# Patient Record
Sex: Female | Born: 1945 | Race: Black or African American | Hispanic: No | Smoking: Never smoker
Health system: Southern US, Community
[De-identification: ages and names within clinical notes are randomized; demographics above are authoritative.]

## PROBLEM LIST (undated history)

## (undated) DIAGNOSIS — E119 Type 2 diabetes mellitus without complications: Secondary | ICD-10-CM

## (undated) DIAGNOSIS — I10 Essential (primary) hypertension: Secondary | ICD-10-CM

## (undated) DIAGNOSIS — J45909 Unspecified asthma, uncomplicated: Secondary | ICD-10-CM

## (undated) DIAGNOSIS — K219 Gastro-esophageal reflux disease without esophagitis: Secondary | ICD-10-CM

## (undated) DIAGNOSIS — E669 Obesity, unspecified: Secondary | ICD-10-CM

---

## 2004-08-28 ENCOUNTER — Ambulatory Visit: Payer: Self-pay | Admitting: Ophthalmology

## 2005-02-07 ENCOUNTER — Ambulatory Visit: Payer: Self-pay | Admitting: Family Medicine

## 2005-06-18 ENCOUNTER — Ambulatory Visit: Payer: Self-pay | Admitting: Internal Medicine

## 2006-05-16 ENCOUNTER — Emergency Department: Payer: Self-pay | Admitting: Emergency Medicine

## 2006-05-19 ENCOUNTER — Inpatient Hospital Stay: Payer: Self-pay

## 2006-05-20 ENCOUNTER — Other Ambulatory Visit: Payer: Self-pay

## 2006-05-27 ENCOUNTER — Emergency Department: Payer: Self-pay | Admitting: Emergency Medicine

## 2006-06-05 ENCOUNTER — Ambulatory Visit: Payer: Self-pay | Admitting: Urology

## 2006-06-18 ENCOUNTER — Ambulatory Visit: Payer: Self-pay | Admitting: Family Medicine

## 2006-06-30 ENCOUNTER — Encounter: Payer: Self-pay | Admitting: Family Medicine

## 2006-07-18 ENCOUNTER — Ambulatory Visit: Payer: Self-pay | Admitting: Otolaryngology

## 2006-07-23 ENCOUNTER — Ambulatory Visit: Payer: Self-pay | Admitting: Internal Medicine

## 2006-08-25 ENCOUNTER — Encounter: Payer: Self-pay | Admitting: Family Medicine

## 2006-09-02 ENCOUNTER — Ambulatory Visit: Payer: Self-pay | Admitting: Family Medicine

## 2006-09-25 ENCOUNTER — Encounter: Payer: Self-pay | Admitting: Family Medicine

## 2006-10-08 ENCOUNTER — Ambulatory Visit: Payer: Self-pay | Admitting: Family Medicine

## 2007-02-18 ENCOUNTER — Ambulatory Visit: Payer: Self-pay | Admitting: Family Medicine

## 2007-07-05 ENCOUNTER — Emergency Department: Payer: Self-pay | Admitting: Emergency Medicine

## 2007-07-17 ENCOUNTER — Emergency Department: Payer: Self-pay | Admitting: Emergency Medicine

## 2007-09-04 ENCOUNTER — Ambulatory Visit: Payer: Self-pay | Admitting: Family Medicine

## 2008-04-12 ENCOUNTER — Other Ambulatory Visit: Payer: Self-pay

## 2008-04-13 ENCOUNTER — Observation Stay: Payer: Self-pay | Admitting: Internal Medicine

## 2008-08-10 ENCOUNTER — Emergency Department: Payer: Self-pay | Admitting: Emergency Medicine

## 2008-09-05 ENCOUNTER — Ambulatory Visit (HOSPITAL_COMMUNITY): Admission: RE | Admit: 2008-09-05 | Discharge: 2008-09-05 | Payer: Self-pay | Admitting: Sports Medicine

## 2008-09-27 ENCOUNTER — Ambulatory Visit: Payer: Self-pay | Admitting: Family Medicine

## 2010-02-27 ENCOUNTER — Ambulatory Visit: Payer: Self-pay

## 2011-07-24 ENCOUNTER — Ambulatory Visit: Payer: Self-pay | Admitting: Family Medicine

## 2012-09-02 ENCOUNTER — Ambulatory Visit: Payer: Self-pay | Admitting: Family Medicine

## 2013-09-22 ENCOUNTER — Ambulatory Visit: Payer: Self-pay | Admitting: Family Medicine

## 2015-06-01 ENCOUNTER — Ambulatory Visit: Admission: RE | Admit: 2015-06-01 | Payer: Medicare Other | Source: Ambulatory Visit | Admitting: Gastroenterology

## 2015-06-01 ENCOUNTER — Encounter: Admission: RE | Payer: Self-pay | Source: Ambulatory Visit

## 2015-06-01 SURGERY — COLONOSCOPY WITH PROPOFOL
Anesthesia: General

## 2015-06-09 ENCOUNTER — Encounter: Payer: Self-pay | Admitting: *Deleted

## 2015-06-12 ENCOUNTER — Ambulatory Visit: Admission: RE | Admit: 2015-06-12 | Payer: Medicare Other | Source: Ambulatory Visit | Admitting: Gastroenterology

## 2015-06-12 ENCOUNTER — Encounter: Admission: RE | Payer: Self-pay | Source: Ambulatory Visit

## 2015-06-12 HISTORY — DX: Essential (primary) hypertension: I10

## 2015-06-12 HISTORY — DX: Unspecified asthma, uncomplicated: J45.909

## 2015-06-12 HISTORY — DX: Type 2 diabetes mellitus without complications: E11.9

## 2015-06-12 HISTORY — DX: Obesity, unspecified: E66.9

## 2015-06-12 HISTORY — DX: Gastro-esophageal reflux disease without esophagitis: K21.9

## 2015-06-12 SURGERY — COLONOSCOPY WITH PROPOFOL
Anesthesia: General

## 2015-08-05 DIAGNOSIS — K644 Residual hemorrhoidal skin tags: Secondary | ICD-10-CM | POA: Diagnosis not present

## 2015-08-05 DIAGNOSIS — J45909 Unspecified asthma, uncomplicated: Secondary | ICD-10-CM | POA: Diagnosis not present

## 2015-08-05 DIAGNOSIS — E669 Obesity, unspecified: Secondary | ICD-10-CM | POA: Diagnosis not present

## 2015-08-05 DIAGNOSIS — I10 Essential (primary) hypertension: Secondary | ICD-10-CM | POA: Diagnosis not present

## 2015-08-05 DIAGNOSIS — Z1211 Encounter for screening for malignant neoplasm of colon: Secondary | ICD-10-CM | POA: Diagnosis not present

## 2015-08-05 DIAGNOSIS — Z7982 Long term (current) use of aspirin: Secondary | ICD-10-CM | POA: Diagnosis not present

## 2015-08-05 DIAGNOSIS — D120 Benign neoplasm of cecum: Secondary | ICD-10-CM | POA: Diagnosis not present

## 2015-08-05 DIAGNOSIS — Z794 Long term (current) use of insulin: Secondary | ICD-10-CM | POA: Diagnosis not present

## 2015-08-05 DIAGNOSIS — D122 Benign neoplasm of ascending colon: Secondary | ICD-10-CM | POA: Diagnosis not present

## 2015-08-05 DIAGNOSIS — Z6839 Body mass index (BMI) 39.0-39.9, adult: Secondary | ICD-10-CM | POA: Diagnosis not present

## 2015-08-05 DIAGNOSIS — Z7951 Long term (current) use of inhaled steroids: Secondary | ICD-10-CM | POA: Diagnosis not present

## 2015-08-05 DIAGNOSIS — K219 Gastro-esophageal reflux disease without esophagitis: Secondary | ICD-10-CM | POA: Diagnosis not present

## 2015-08-05 DIAGNOSIS — E119 Type 2 diabetes mellitus without complications: Secondary | ICD-10-CM | POA: Diagnosis not present

## 2015-08-07 ENCOUNTER — Encounter
Admission: RE | Disposition: A | Discharge status: Home / Self Care | Payer: Self-pay | Source: Ambulatory Visit | Attending: Gastroenterology

## 2015-08-07 ENCOUNTER — Ambulatory Visit
Admission: RE | Admit: 2015-08-07 | Discharge: 2015-08-07 | Disposition: A | Discharge status: Home / Self Care | Payer: Medicare Other | Source: Ambulatory Visit | Attending: Gastroenterology | Admitting: Gastroenterology

## 2015-08-07 ENCOUNTER — Ambulatory Visit: Payer: Medicare Other | Admitting: Anesthesiology

## 2015-08-07 ENCOUNTER — Encounter: Payer: Self-pay | Admitting: Anesthesiology

## 2015-08-07 DIAGNOSIS — K219 Gastro-esophageal reflux disease without esophagitis: Secondary | ICD-10-CM | POA: Insufficient documentation

## 2015-08-07 DIAGNOSIS — Z1211 Encounter for screening for malignant neoplasm of colon: Secondary | ICD-10-CM | POA: Diagnosis not present

## 2015-08-07 DIAGNOSIS — K644 Residual hemorrhoidal skin tags: Secondary | ICD-10-CM | POA: Insufficient documentation

## 2015-08-07 DIAGNOSIS — Z6839 Body mass index (BMI) 39.0-39.9, adult: Secondary | ICD-10-CM | POA: Insufficient documentation

## 2015-08-07 DIAGNOSIS — Z794 Long term (current) use of insulin: Secondary | ICD-10-CM | POA: Insufficient documentation

## 2015-08-07 DIAGNOSIS — Z7951 Long term (current) use of inhaled steroids: Secondary | ICD-10-CM | POA: Insufficient documentation

## 2015-08-07 DIAGNOSIS — J45909 Unspecified asthma, uncomplicated: Secondary | ICD-10-CM | POA: Insufficient documentation

## 2015-08-07 DIAGNOSIS — E669 Obesity, unspecified: Secondary | ICD-10-CM | POA: Insufficient documentation

## 2015-08-07 DIAGNOSIS — E119 Type 2 diabetes mellitus without complications: Secondary | ICD-10-CM | POA: Insufficient documentation

## 2015-08-07 DIAGNOSIS — D120 Benign neoplasm of cecum: Secondary | ICD-10-CM | POA: Insufficient documentation

## 2015-08-07 DIAGNOSIS — D122 Benign neoplasm of ascending colon: Secondary | ICD-10-CM | POA: Insufficient documentation

## 2015-08-07 DIAGNOSIS — I10 Essential (primary) hypertension: Secondary | ICD-10-CM | POA: Insufficient documentation

## 2015-08-07 DIAGNOSIS — Z7982 Long term (current) use of aspirin: Secondary | ICD-10-CM | POA: Insufficient documentation

## 2015-08-07 HISTORY — PX: COLONOSCOPY WITH PROPOFOL: SHX5780

## 2015-08-07 LAB — GLUCOSE, CAPILLARY: GLUCOSE-CAPILLARY: 171 mg/dL — AB (ref 65–99)

## 2015-08-07 SURGERY — COLONOSCOPY WITH PROPOFOL
Anesthesia: General

## 2015-08-07 MED ORDER — PROPOFOL 10 MG/ML IV BOLUS
INTRAVENOUS | Status: DC | PRN
  Administered 2015-08-07: 80 mg via INTRAVENOUS

## 2015-08-07 MED ORDER — SODIUM CHLORIDE 0.9 % IV SOLN
INTRAVENOUS | Status: DC
  Administered 2015-08-07: 1000 mL via INTRAVENOUS

## 2015-08-07 MED ORDER — SODIUM CHLORIDE 0.9 % IV SOLN: INTRAVENOUS | Status: DC

## 2015-08-07 MED ORDER — PROPOFOL INFUSION 10 MG/ML OPTIME
INTRAVENOUS | Status: DC | PRN
  Administered 2015-08-07: 120 ug/kg/min via INTRAVENOUS

## 2015-08-07 MED ORDER — LIDOCAINE HCL (CARDIAC) 20 MG/ML IV SOLN
INTRAVENOUS | Status: DC | PRN
  Administered 2015-08-07: 100 mg via INTRAVENOUS

## 2015-08-07 NOTE — Anesthesia Preprocedure Evaluation (Addendum)
Anesthesia Evaluation  Patient identified by MRN, date of birth, ID band Patient awake    Reviewed: Allergy & Precautions, H&P , NPO status , Patient's Chart, lab work & pertinent test results  History of Anesthesia Complications Negative for: history of anesthetic complications  Airway Mallampati: III  TM Distance: >3 FB Neck ROM: limited    Dental no notable dental hx. (+) Poor Dentition, Missing, Chipped, Upper Dentures   Pulmonary neg shortness of breath, asthma ,    Pulmonary exam normal breath sounds clear to auscultation       Cardiovascular Exercise Tolerance: Good hypertension, (-) Past MI Normal cardiovascular exam Rhythm:regular Rate:Normal     Neuro/Psych negative neurological ROS  negative psych ROS   GI/Hepatic Neg liver ROS, GERD  Controlled,  Endo/Other  diabetes, Type 2, Insulin Dependent  Renal/GU negative Renal ROS  negative genitourinary   Musculoskeletal   Abdominal   Peds  Hematology negative hematology ROS (+)   Anesthesia Other Findings Past Medical History:   Morbid Obesity                                                      GERD (gastroesophageal reflux disease)                       Hypertension                                                 Asthma                                                       Diabetes mellitus without complication                       Signs and symptoms suggestive of sleep apnea    Reproductive/Obstetrics negative OB ROS                            Anesthesia Physical Anesthesia Plan  ASA: III  Anesthesia Plan: General   Post-op Pain Management:    Induction:   Airway Management Planned:   Additional Equipment:   Intra-op Plan:   Post-operative Plan:   Informed Consent: I have reviewed the patients History and Physical, chart, labs and discussed the procedure including the risks, benefits and alternatives for the  proposed anesthesia with the patient or authorized representative who has indicated his/her understanding and acceptance.   Dental Advisory Given  Plan Discussed with: Anesthesiologist, CRNA and Surgeon  Anesthesia Plan Comments:         Anesthesia Quick Evaluation

## 2015-08-07 NOTE — H&P (Signed)
  Primary Care Physician:  Baltazar Apo, MD  Pre-Procedure History & Physical: HPI:  Gloria Blair is a 69 y.o. female is here for an colonoscopy.   Past Medical History  Diagnosis Date  . Obesity   . GERD (gastroesophageal reflux disease)   . Hypertension   . Asthma   . Diabetes mellitus without complication     History reviewed. No pertinent past surgical history.  Prior to Admission medications   Medication Sig Start Date End Date Taking? Authorizing Provider  albuterol-ipratropium (COMBIVENT) 18-103 MCG/ACT inhaler Inhale 2 puffs into the lungs every 6 (six) hours as needed for wheezing or shortness of breath.   Yes Historical Provider, MD  aspirin 81 MG tablet Take 81 mg by mouth daily.   Yes Historical Provider, MD  atorvastatin (LIPITOR) 80 MG tablet Take 80 mg by mouth daily.   Yes Historical Provider, MD  cloNIDine (CATAPRES) 0.1 MG tablet Take 0.1 mg by mouth 2 (two) times daily.   Yes Historical Provider, MD  geriatric multivitamins-minerals (ELDERTONIC/GEVRABON) ELIX Take 15 mLs by mouth daily.   Yes Historical Provider, MD  glipiZIDE (GLUCOTROL) 10 MG tablet Take 10 mg by mouth daily before breakfast.   Yes Historical Provider, MD  insulin detemir (LEVEMIR) 100 UNIT/ML injection Inject into the skin at bedtime.   Yes Historical Provider, MD  lisinopril-hydrochlorothiazide (PRINZIDE,ZESTORETIC) 20-25 MG per tablet Take 1 tablet by mouth daily.   Yes Historical Provider, MD  meloxicam (MOBIC) 7.5 MG tablet Take 7.5 mg by mouth daily.   Yes Historical Provider, MD  metFORMIN (GLUCOPHAGE) 1000 MG tablet Take 1,000 mg by mouth 2 (two) times daily with a meal.   Yes Historical Provider, MD  Multiple Vitamin (MULTIVITAMIN) capsule Take 1 capsule by mouth daily.   Yes Historical Provider, MD    Allergies as of 07/03/2015  . (No Known Allergies)    History reviewed. No pertinent family history.  Social History   Social History  . Marital Status: Widowed    Spouse Name:  N/A  . Number of Children: N/A  . Years of Education: N/A   Occupational History  . Not on file.   Social History Main Topics  . Smoking status: Not on file  . Smokeless tobacco: Not on file  . Alcohol Use: Not on file  . Drug Use: Not on file  . Sexual Activity: Not on file   Other Topics Concern  . Not on file   Social History Narrative     Physical Exam: BP 129/64 mmHg  Pulse 67  Temp(Src) 97.1 F (36.2 C) (Tympanic)  Resp 12  Ht 5\' 5"  (1.651 m)  Wt 108.863 kg (240 lb)  BMI 39.94 kg/m2  SpO2 98% General:   Alert,  pleasant and cooperative in NAD Head:  Normocephalic and atraumatic. Neck:  Supple; no masses or thyromegaly. Lungs:  Clear throughout to auscultation.    Heart:  Regular rate and rhythm. Abdomen:  Soft, nontender and nondistended. Normal bowel sounds, without guarding, and without rebound.   Neurologic:  Alert and  oriented x4;  grossly normal neurologically.  Impression/Plan: [name] is here for an colonoscopy to be performed for screening  Risks, benefits, limitations, and alternatives regarding  colonoscopy have been reviewed with the patient.  Questions have been answered.  All parties agreeable.   Josefine Class, MD  08/07/2015, 8:11 AM

## 2015-08-07 NOTE — Op Note (Signed)
[facility] Gastroenterology Patient Name: Gloria Blair Procedure Date: 08/07/2015 7:50 AM MRN: [id number] Account #: [id number] Date of Birth: Mar 27, 1946 Admit Type: Outpatient Age: 69 Room: [facility] ENDO ROOM 2 Gender: Female Note Status: Finalized Procedure:         Colonoscopy Indications:       Screening for colorectal malignant neoplasm, Last                     colonoscopy: 2006 Patient Profile:   This is a 69 year old female. Providers:         Gerrit Heck. Rayann Heman, MD Referring MD:      Baltazar Apo, MD (Referring MD) Medicines:         Propofol per Anesthesia Complications:     No immediate complications. Procedure:         Pre-Anesthesia Assessment:                    - Prior to the procedure, a History and Physical was                     performed, and patient medications, allergies and                     sensitivities were reviewed. The patient's tolerance of                     previous anesthesia was reviewed.                    After obtaining informed consent, the colonoscope was                     passed under direct vision. Throughout the procedure, the                     patient's blood pressure, pulse, and oxygen saturations                     were monitored continuously. The Colonoscope was                     introduced through the anus and advanced to the the cecum,                     identified by appendiceal orifice and ileocecal valve. The                     colonoscopy was performed without difficulty. The patient                     tolerated the procedure well. The quality of the bowel                     preparation was good. Findings:      The perianal exam findings include non-thrombosed external hemorrhoids.      A 2 mm polyp was found in the cecum. The polyp was sessile. The polyp       was removed with a jumbo cold forceps. Resection and retrieval were       complete.      A 2 mm polyp was found in the ascending colon. The  polyp was sessile.      The exam was otherwise without abnormality on direct and retroflexion       views.  Impression:        - Non-thrombosed external hemorrhoids found on perianal                     exam.                    - One 2 mm polyp in the cecum. Resected and retrieved.                    - One 2 mm polyp in the ascending colon.                    - The examination was otherwise normal on direct and                     retroflexion views. Recommendation:    - Observe patient in GI recovery unit.                    - High fiber diet.                    - Continue present medications.                    - Await pathology results.                    - Repeat colonoscopy for surveillance based on pathology                     results.                    - Return to referring physician.                    - The findings and recommendations were discussed with the                     patient.                    - The findings and recommendations were discussed with the                     patient's family. Procedure Code(s): --- Professional ---                    [phone], Colonoscopy, flexible; with biopsy, single or                     multiple CPT copyright 2014 American Medical Association. All rights reserved. The codes documented in this report are preliminary and upon coder review may  be revised to meet current compliance requirements. Mellody Life, MD 08/07/2015 8:38:55 AM This report has been signed electronically. Number of Addenda: 0 Note Initiated On: 08/07/2015 7:50 AM Scope Withdrawal Time: 0 hours 14 minutes 45 seconds  Total Procedure Duration: 0 hours 17 minutes 49 seconds       [facility]

## 2015-08-07 NOTE — Anesthesia Postprocedure Evaluation (Signed)
  Anesthesia Post-op Note  Patient: Gloria Blair  Procedure(s) Performed: Procedure(s): COLONOSCOPY WITH PROPOFOL (N/A)  Anesthesia type:General  Patient location: PACU  Post pain: Pain level controlled  Post assessment: Post-op Vital signs reviewed, Patient's Cardiovascular Status Stable, Respiratory Function Stable, Patent Airway and No signs of Nausea or vomiting  Post vital signs: Reviewed and stable  Last Vitals:  Filed Vitals:   08/07/15 0909  BP: 138/65  Pulse: 63  Temp:   Resp: 15    Level of consciousness: awake, alert  and patient cooperative  Complications: No apparent anesthesia complications

## 2015-08-07 NOTE — Discharge Instructions (Signed)

## 2015-08-07 NOTE — Transfer of Care (Signed)
Immediate Anesthesia Transfer of Care Note  Patient: Gloria Blair  Procedure(s) Performed: Procedure(s): COLONOSCOPY WITH PROPOFOL (N/A)  Patient Location: Endoscopy Unit  Anesthesia Type:General  Level of Consciousness: sedated  Airway & Oxygen Therapy: Patient Spontanous Breathing and Patient connected to nasal cannula oxygen  Post-op Assessment: Report given to RN and Post -op Vital signs reviewed and stable  Post vital signs: Reviewed and stable  Last Vitals:  Filed Vitals:   08/07/15 0708  BP: 129/64  Pulse: 67  Temp: 36.2 C  Resp: 12    Complications: No apparent anesthesia complications

## 2015-08-08 ENCOUNTER — Encounter: Payer: Self-pay | Admitting: Gastroenterology

## 2015-08-08 LAB — SURGICAL PATHOLOGY

## 2015-11-02 ENCOUNTER — Other Ambulatory Visit: Payer: Self-pay | Admitting: Family Medicine

## 2015-11-02 DIAGNOSIS — Z1231 Encounter for screening mammogram for malignant neoplasm of breast: Secondary | ICD-10-CM

## 2015-11-14 ENCOUNTER — Ambulatory Visit: Payer: Medicare Other | Attending: Family Medicine

## 2016-06-12 ENCOUNTER — Other Ambulatory Visit: Payer: Self-pay | Admitting: Family Medicine

## 2016-06-12 ENCOUNTER — Ambulatory Visit: Payer: Medicare Other

## 2016-06-12 ENCOUNTER — Ambulatory Visit
Admission: RE | Admit: 2016-06-12 | Discharge: 2016-06-12 | Disposition: A | Discharge status: Home / Self Care | Payer: Medicare Other | Source: Ambulatory Visit | Attending: Family Medicine | Admitting: Family Medicine

## 2016-06-12 DIAGNOSIS — Z1231 Encounter for screening mammogram for malignant neoplasm of breast: Secondary | ICD-10-CM | POA: Diagnosis present

## 2017-12-29 ENCOUNTER — Other Ambulatory Visit: Payer: Self-pay | Admitting: Family Medicine

## 2017-12-29 DIAGNOSIS — Z1231 Encounter for screening mammogram for malignant neoplasm of breast: Secondary | ICD-10-CM

## 2018-01-13 ENCOUNTER — Ambulatory Visit
Admission: RE | Admit: 2018-01-13 | Discharge: 2018-01-13 | Disposition: A | Discharge status: Home / Self Care | Payer: Medicare Other | Source: Ambulatory Visit | Attending: Family Medicine | Admitting: Family Medicine

## 2018-01-13 DIAGNOSIS — Z1231 Encounter for screening mammogram for malignant neoplasm of breast: Secondary | ICD-10-CM | POA: Diagnosis present

## 2018-08-18 ENCOUNTER — Encounter: Payer: Self-pay | Admitting: Medical Oncology

## 2018-08-18 ENCOUNTER — Emergency Department
Admission: EM | Admit: 2018-08-18 | Discharge: 2018-08-18 | Disposition: A | Discharge status: Home / Self Care | Payer: Medicare Other | Attending: Emergency Medicine | Admitting: Emergency Medicine

## 2018-08-18 DIAGNOSIS — R51 Headache: Secondary | ICD-10-CM | POA: Diagnosis not present

## 2018-08-18 DIAGNOSIS — Z5321 Procedure and treatment not carried out due to patient leaving prior to being seen by health care provider: Secondary | ICD-10-CM | POA: Insufficient documentation

## 2018-08-18 DIAGNOSIS — M542 Cervicalgia: Secondary | ICD-10-CM | POA: Diagnosis not present

## 2018-08-18 NOTE — ED Triage Notes (Signed)
Pt reports that she began yesterday having neck pain, pain has now began to radiate into head. Pt denies injury, denies fever. Denies hx of migraine headaches.

## 2019-02-03 ENCOUNTER — Other Ambulatory Visit: Payer: Self-pay | Admitting: Family Medicine

## 2019-02-03 DIAGNOSIS — Z1231 Encounter for screening mammogram for malignant neoplasm of breast: Secondary | ICD-10-CM

## 2019-02-10 ENCOUNTER — Ambulatory Visit
Admission: RE | Admit: 2019-02-10 | Discharge: 2019-02-10 | Disposition: A | Discharge status: Home / Self Care | Payer: Medicare Other | Source: Ambulatory Visit | Attending: Family Medicine | Admitting: Family Medicine

## 2019-02-10 ENCOUNTER — Other Ambulatory Visit: Payer: Self-pay

## 2019-02-10 DIAGNOSIS — Z1231 Encounter for screening mammogram for malignant neoplasm of breast: Secondary | ICD-10-CM | POA: Diagnosis present

## 2020-01-11 ENCOUNTER — Inpatient Hospital Stay
Admission: EM | Admit: 2020-01-11 | Discharge: 2020-01-13 | DRG: 177 | Disposition: A | Discharge status: Home / Self Care | Payer: Medicare Other | Attending: Internal Medicine | Admitting: Internal Medicine

## 2020-01-11 ENCOUNTER — Other Ambulatory Visit: Payer: Self-pay

## 2020-01-11 ENCOUNTER — Emergency Department: Payer: Medicare Other

## 2020-01-11 DIAGNOSIS — I700 Atherosclerosis of aorta: Secondary | ICD-10-CM | POA: Diagnosis present

## 2020-01-11 DIAGNOSIS — J45909 Unspecified asthma, uncomplicated: Secondary | ICD-10-CM | POA: Diagnosis present

## 2020-01-11 DIAGNOSIS — Z794 Long term (current) use of insulin: Secondary | ICD-10-CM | POA: Diagnosis not present

## 2020-01-11 DIAGNOSIS — Z791 Long term (current) use of non-steroidal anti-inflammatories (NSAID): Secondary | ICD-10-CM

## 2020-01-11 DIAGNOSIS — I4891 Unspecified atrial fibrillation: Secondary | ICD-10-CM | POA: Diagnosis present

## 2020-01-11 DIAGNOSIS — Z7982 Long term (current) use of aspirin: Secondary | ICD-10-CM

## 2020-01-11 DIAGNOSIS — E11649 Type 2 diabetes mellitus with hypoglycemia without coma: Secondary | ICD-10-CM | POA: Diagnosis not present

## 2020-01-11 DIAGNOSIS — J1282 Pneumonia due to coronavirus disease 2019: Secondary | ICD-10-CM | POA: Diagnosis not present

## 2020-01-11 DIAGNOSIS — K219 Gastro-esophageal reflux disease without esophagitis: Secondary | ICD-10-CM | POA: Diagnosis present

## 2020-01-11 DIAGNOSIS — Z6837 Body mass index (BMI) 37.0-37.9, adult: Secondary | ICD-10-CM | POA: Diagnosis not present

## 2020-01-11 DIAGNOSIS — E785 Hyperlipidemia, unspecified: Secondary | ICD-10-CM | POA: Diagnosis present

## 2020-01-11 DIAGNOSIS — Z79899 Other long term (current) drug therapy: Secondary | ICD-10-CM | POA: Diagnosis not present

## 2020-01-11 DIAGNOSIS — E669 Obesity, unspecified: Secondary | ICD-10-CM | POA: Diagnosis present

## 2020-01-11 DIAGNOSIS — I10 Essential (primary) hypertension: Secondary | ICD-10-CM | POA: Diagnosis present

## 2020-01-11 DIAGNOSIS — U071 COVID-19: Principal | ICD-10-CM | POA: Diagnosis present

## 2020-01-11 DIAGNOSIS — I480 Paroxysmal atrial fibrillation: Secondary | ICD-10-CM | POA: Diagnosis not present

## 2020-01-11 LAB — LACTIC ACID, PLASMA: Lactic Acid, Venous: 1.7 mmol/L (ref 0.5–1.9)

## 2020-01-11 LAB — FIBRIN DERIVATIVES D-DIMER (ARMC ONLY): Fibrin derivatives D-dimer (ARMC): 965.38 ng/mL (FEU) — ABNORMAL HIGH (ref 0.00–499.00)

## 2020-01-11 LAB — CBC
HCT: 40.4 % (ref 36.0–46.0)
Hemoglobin: 12.9 g/dL (ref 12.0–15.0)
MCH: 26 pg (ref 26.0–34.0)
MCHC: 31.9 g/dL (ref 30.0–36.0)
MCV: 81.3 fL (ref 80.0–100.0)
Platelets: 236 10*3/uL (ref 150–400)
RBC: 4.97 MIL/uL (ref 3.87–5.11)
RDW: 16.2 % — ABNORMAL HIGH (ref 11.5–15.5)
WBC: 5.4 10*3/uL (ref 4.0–10.5)
nRBC: 0 % (ref 0.0–0.2)

## 2020-01-11 LAB — BASIC METABOLIC PANEL
Anion gap: 10 (ref 5–15)
BUN: 17 mg/dL (ref 8–23)
CO2: 26 mmol/L (ref 22–32)
Calcium: 8.6 mg/dL — ABNORMAL LOW (ref 8.9–10.3)
Chloride: 100 mmol/L (ref 98–111)
Creatinine, Ser: 0.87 mg/dL (ref 0.44–1.00)
GFR calc Af Amer: >60 mL/min (ref >60)
GFR calc non Af Amer: >60 mL/min (ref >60)
Glucose, Bld: 101 mg/dL — ABNORMAL HIGH (ref 70–99)
Potassium: 3.5 mmol/L (ref 3.5–5.1)
Sodium: 136 mmol/L (ref 135–145)

## 2020-01-11 LAB — HEPATIC FUNCTION PANEL
ALT: 25 U/L (ref 0–44)
AST: 53 U/L — ABNORMAL HIGH (ref 15–41)
Albumin: 3.5 g/dL (ref 3.5–5.0)
Alkaline Phosphatase: 61 U/L (ref 38–126)
Bilirubin, Direct: 0.4 mg/dL — ABNORMAL HIGH (ref 0.0–0.2)
Indirect Bilirubin: 0.7 mg/dL (ref 0.3–0.9)
Total Bilirubin: 1.1 mg/dL (ref 0.3–1.2)
Total Protein: 8.2 g/dL — ABNORMAL HIGH (ref 6.5–8.1)

## 2020-01-11 LAB — FIBRINOGEN: Fibrinogen: 531 mg/dL — ABNORMAL HIGH (ref 210–475)

## 2020-01-11 LAB — MAGNESIUM: Magnesium: 1.5 mg/dL — ABNORMAL LOW (ref 1.7–2.4)

## 2020-01-11 LAB — FERRITIN: Ferritin: 201 ng/mL (ref 11–307)

## 2020-01-11 LAB — TSH: TSH: 0.423 u[IU]/mL (ref 0.350–4.500)

## 2020-01-11 LAB — ABO/RH: ABO/RH(D): O POS

## 2020-01-11 LAB — POC SARS CORONAVIRUS 2 AG: SARS Coronavirus 2 Ag: POSITIVE — AB

## 2020-01-11 LAB — PROCALCITONIN: Procalcitonin: <0.1 ng/mL

## 2020-01-11 LAB — C-REACTIVE PROTEIN: CRP: 3.5 mg/dL — ABNORMAL HIGH (ref <1.0)

## 2020-01-11 LAB — LACTATE DEHYDROGENASE: LDH: 192 U/L (ref 98–192)

## 2020-01-11 LAB — TRIGLYCERIDES: Triglycerides: 93 mg/dL (ref <150)

## 2020-01-11 LAB — T4, FREE: Free T4: 1.1 ng/dL (ref 0.61–1.12)

## 2020-01-11 MED ORDER — POTASSIUM CHLORIDE 20 MEQ PO PACK
40.0000 meq | PACK | Freq: Once | ORAL | Status: AC
  Administered 2020-01-11: 23:00:00 40 meq via ORAL
  Filled 2020-01-11: qty 2

## 2020-01-11 MED ORDER — DEXAMETHASONE SODIUM PHOSPHATE 10 MG/ML IJ SOLN
6.0000 mg | INTRAMUSCULAR | Status: DC
  Administered 2020-01-11 – 2020-01-12 (×2): 6 mg via INTRAVENOUS
  Filled 2020-01-11 (×2): qty 1

## 2020-01-11 MED ORDER — ACETAMINOPHEN 325 MG PO TABS
650.0000 mg | ORAL_TABLET | Freq: Four times a day (QID) | ORAL | Status: DC | PRN
  Administered 2020-01-12: 650 mg via ORAL
  Filled 2020-01-11: qty 2

## 2020-01-11 MED ORDER — INSULIN ASPART 100 UNIT/ML ~~LOC~~ SOLN
0.0000 [IU] | Freq: Three times a day (TID) | SUBCUTANEOUS | Status: DC
  Administered 2020-01-12 (×2): 2 [IU] via SUBCUTANEOUS
  Administered 2020-01-13: 08:00:00 5 [IU] via SUBCUTANEOUS
  Filled 2020-01-11 (×3): qty 1

## 2020-01-11 MED ORDER — APIXABAN 5 MG PO TABS
5.0000 mg | ORAL_TABLET | Freq: Two times a day (BID) | ORAL | Status: DC
  Administered 2020-01-11 – 2020-01-13 (×4): 5 mg via ORAL
  Filled 2020-01-11 (×4): qty 1

## 2020-01-11 MED ORDER — ADULT MULTIVITAMIN W/MINERALS CH
1.0000 | ORAL_TABLET | Freq: Every day | ORAL | Status: DC
  Administered 2020-01-11 – 2020-01-13 (×3): 1 via ORAL
  Filled 2020-01-11 (×3): qty 1

## 2020-01-11 MED ORDER — ATORVASTATIN CALCIUM 20 MG PO TABS
80.0000 mg | ORAL_TABLET | Freq: Every day | ORAL | Status: DC
  Administered 2020-01-11 – 2020-01-13 (×3): 80 mg via ORAL
  Filled 2020-01-11 (×3): qty 4

## 2020-01-11 MED ORDER — SODIUM CHLORIDE 0.9 % IV SOLN: 200.0000 mg | Freq: Once | INTRAVENOUS | Status: DC

## 2020-01-11 MED ORDER — ACETAMINOPHEN 500 MG PO TABS
1000.0000 mg | ORAL_TABLET | Freq: Once | ORAL | Status: AC
  Administered 2020-01-11: 1000 mg via ORAL
  Filled 2020-01-11: qty 2

## 2020-01-11 MED ORDER — LISINOPRIL 20 MG PO TABS
20.0000 mg | ORAL_TABLET | Freq: Every day | ORAL | Status: DC
  Administered 2020-01-11 – 2020-01-13 (×3): 20 mg via ORAL
  Filled 2020-01-11 (×3): qty 1

## 2020-01-11 MED ORDER — MAGNESIUM HYDROXIDE 400 MG/5ML PO SUSP
30.0000 mL | Freq: Every day | ORAL | Status: DC | PRN
  Filled 2020-01-11: qty 30

## 2020-01-11 MED ORDER — SODIUM CHLORIDE 0.9 % IV BOLUS
500.0000 mL | Freq: Once | INTRAVENOUS | Status: AC
  Administered 2020-01-11: 500 mL via INTRAVENOUS

## 2020-01-11 MED ORDER — ONDANSETRON HCL 4 MG/2ML IJ SOLN: 4.0000 mg | Freq: Four times a day (QID) | INTRAMUSCULAR | Status: DC | PRN

## 2020-01-11 MED ORDER — HYDROCOD POLST-CPM POLST ER 10-8 MG/5ML PO SUER: 5.0000 mL | Freq: Two times a day (BID) | ORAL | Status: DC | PRN

## 2020-01-11 MED ORDER — ALBUTEROL SULFATE HFA 108 (90 BASE) MCG/ACT IN AERS
2.0000 | INHALATION_SPRAY | Freq: Four times a day (QID) | RESPIRATORY_TRACT | Status: DC | PRN
  Filled 2020-01-11: qty 6.7

## 2020-01-11 MED ORDER — IOHEXOL 350 MG/ML SOLN
75.0000 mL | Freq: Once | INTRAVENOUS | Status: AC | PRN
  Administered 2020-01-11: 75 mL via INTRAVENOUS

## 2020-01-11 MED ORDER — ENOXAPARIN SODIUM 40 MG/0.4ML ~~LOC~~ SOLN: 40.0000 mg | SUBCUTANEOUS | Status: DC

## 2020-01-11 MED ORDER — ADULT MULTIVITAMIN LIQUID CH: 15.0000 mL | Freq: Every day | ORAL | Status: DC

## 2020-01-11 MED ORDER — ASPIRIN EC 81 MG PO TBEC
81.0000 mg | DELAYED_RELEASE_TABLET | Freq: Every day | ORAL | Status: DC
  Administered 2020-01-11 – 2020-01-13 (×3): 81 mg via ORAL
  Filled 2020-01-11 (×3): qty 1

## 2020-01-11 MED ORDER — SODIUM CHLORIDE 0.9 % IV SOLN
100.0000 mg | Freq: Every day | INTRAVENOUS | Status: DC
  Administered 2020-01-12: 09:00:00 100 mg via INTRAVENOUS
  Filled 2020-01-11: qty 20

## 2020-01-11 MED ORDER — SODIUM CHLORIDE 0.9 % IV SOLN: 100.0000 mg | Freq: Every day | INTRAVENOUS | Status: DC

## 2020-01-11 MED ORDER — FAMOTIDINE 20 MG PO TABS
20.0000 mg | ORAL_TABLET | Freq: Two times a day (BID) | ORAL | Status: DC
  Administered 2020-01-11 – 2020-01-13 (×4): 20 mg via ORAL
  Filled 2020-01-11 (×4): qty 1

## 2020-01-11 MED ORDER — LISINOPRIL-HYDROCHLOROTHIAZIDE 20-25 MG PO TABS: 1.0000 | ORAL_TABLET | Freq: Every day | ORAL | Status: DC

## 2020-01-11 MED ORDER — TRAZODONE HCL 50 MG PO TABS
25.0000 mg | ORAL_TABLET | Freq: Every evening | ORAL | Status: DC | PRN
  Administered 2020-01-12: 22:00:00 25 mg via ORAL
  Filled 2020-01-11: qty 1

## 2020-01-11 MED ORDER — IPRATROPIUM-ALBUTEROL 20-100 MCG/ACT IN AERS
1.0000 | INHALATION_SPRAY | Freq: Four times a day (QID) | RESPIRATORY_TRACT | Status: DC | PRN
  Filled 2020-01-11: qty 4

## 2020-01-11 MED ORDER — INSULIN DETEMIR 100 UNIT/ML ~~LOC~~ SOLN
20.0000 [IU] | Freq: Every day | SUBCUTANEOUS | Status: DC
  Administered 2020-01-12 – 2020-01-13 (×2): 20 [IU] via SUBCUTANEOUS
  Filled 2020-01-11 (×2): qty 1

## 2020-01-11 MED ORDER — ASPIRIN EC 81 MG PO TBEC: 81.0000 mg | DELAYED_RELEASE_TABLET | Freq: Every day | ORAL | Status: DC

## 2020-01-11 MED ORDER — SODIUM CHLORIDE 0.9 % IV SOLN: INTRAVENOUS | Status: DC

## 2020-01-11 MED ORDER — ONDANSETRON HCL 4 MG PO TABS: 4.0000 mg | ORAL_TABLET | Freq: Four times a day (QID) | ORAL | Status: DC | PRN

## 2020-01-11 MED ORDER — SODIUM CHLORIDE 0.9 % IV SOLN
200.0000 mg | Freq: Once | INTRAVENOUS | Status: AC
  Administered 2020-01-11: 23:00:00 200 mg via INTRAVENOUS
  Filled 2020-01-11: qty 200

## 2020-01-11 MED ORDER — GUAIFENESIN-DM 100-10 MG/5ML PO SYRP: 10.0000 mL | ORAL_SOLUTION | ORAL | Status: DC | PRN

## 2020-01-11 MED ORDER — GLIPIZIDE 10 MG PO TABS
10.0000 mg | ORAL_TABLET | Freq: Every day | ORAL | Status: DC
  Administered 2020-01-12 – 2020-01-13 (×2): 10 mg via ORAL
  Filled 2020-01-11 (×3): qty 1

## 2020-01-11 MED ORDER — VITAMIN D 25 MCG (1000 UNIT) PO TABS
1000.0000 [IU] | ORAL_TABLET | Freq: Every day | ORAL | Status: DC
  Administered 2020-01-11 – 2020-01-13 (×3): 1000 [IU] via ORAL
  Filled 2020-01-11 (×3): qty 1

## 2020-01-11 MED ORDER — CLONIDINE HCL 0.1 MG PO TABS
0.1000 mg | ORAL_TABLET | Freq: Two times a day (BID) | ORAL | Status: DC
  Administered 2020-01-11 – 2020-01-13 (×4): 0.1 mg via ORAL
  Filled 2020-01-11 (×4): qty 1

## 2020-01-11 MED ORDER — HYDROCHLOROTHIAZIDE 25 MG PO TABS
25.0000 mg | ORAL_TABLET | Freq: Every day | ORAL | Status: DC
  Administered 2020-01-11 – 2020-01-13 (×3): 25 mg via ORAL
  Filled 2020-01-11 (×3): qty 1

## 2020-01-11 NOTE — ED Notes (Signed)
Gloria Blair daughter: [phone]

## 2020-01-11 NOTE — ED Notes (Signed)
Report given to Gracie RN.

## 2020-01-11 NOTE — Consult Note (Signed)
Remdesivir - Pharmacy Brief Note   O:  ALT: 25 CXR: Subtle focus of patchy airspace opacity at the left lung base. Otherwise unremarkable. SpO2: 96% on RA   A/P:  Remdesivir 200 mg IVPB once followed by 100 mg IVPB daily x 4 days.   Pearla Dubonnet, PharmD Clinical Pharmacist 01/11/2020 8:06 PM

## 2020-01-11 NOTE — ED Triage Notes (Signed)
Dx with COVID X 3 days ago, SOB. Pt able to ambulate to triage room without difficulty. Pt alert and oriented X4, cooperative, RR even and unlabored, color WNL. Pt in NAD.

## 2020-01-11 NOTE — H&P (Signed)
[facility] [place] NAME: Rennee Lienhart    MR#:  [id number]  DATE OF BIRTH:  11-19-1946  DATE OF ADMISSION:  01/11/2020  PRIMARY CARE PHYSICIAN: Denton Lank, MD   REQUESTING/REFERRING PHYSICIAN: Marjean Donna, MD  CHIEF COMPLAINT:   Chief Complaint  Patient presents with  . Shortness of Breath  . COVID    HISTORY OF PRESENT ILLNESS:  Tarita Derosia  is a 74 y.o. African-American female with a known history of asthma, type diabetes mellitus, hypertension and GERD as well as recent COVID-41 diagnosed about 5 days ago, who presented to the emergency room with acute onset of generalized weakness that started yesterday with mild dry cough.  She denies any body aches, nausea vomiting or diarrhea or dyspnea or wheezing.  She denied any loss of taste or smell.  She denied any fever or chills.  No chest pain or palpitations.  No headache or dizziness or blurred vision.  Denies any dysuria, oliguria or hematuria or flank pain.  Upon presentation to the emergency room, blood pressure was 153/90 with respiratory to 22 and pulse ox 90 was 96%to 100% on room air.  Labs revealed a potassium of 3.5 and AST 53 with unremarkable CBC.  Procalcitonin was less than 0.1 and lactic acid 1.7.  CRP came back 3.5 201 with LDH of 192.  TSH was 0.4 with free T4 of 1.1.  D-dimer was 965.38 with fibrinogen of 531.  Potassium was borderline at 3.5.  EKG showed atrial fibrillation with rapid ventricular sponsor of 145 and later after hydration with IV normal saline repeat EKG showed normal sinus rhythm with rate of 97 with low voltage QRS and T wave inversion inferiorly.  Two-view chest x-ray showed stable focus of patchy airspace opacity in the left lung base.  Chest CTA revealed patchy peripheral predominant groundglass opacities with a lower lung predominance, compatible with multifocal pneumonia with positive Covid test.  It showed aortic atherosclerosis with no evidence for acute PE.  The  patient was given IV remdesivir, 1 g of p.o. Tylenol and 500 mill of IV normal saline bolus.  She will be admitted to a medical monitored isolation bed for further evaluation and management. PAST MEDICAL HISTORY:   Past Medical History:  Diagnosis Date  . Asthma   . Diabetes mellitus without complication ([place])   . GERD (gastroesophageal reflux disease)   . Hypertension   . Obesity     PAST SURGICAL HISTORY:   Past Surgical History:  Procedure Laterality Date  . COLONOSCOPY WITH PROPOFOL N/A 08/07/2015   Procedure: COLONOSCOPY WITH PROPOFOL;  Surgeon: Josefine Class, MD;  Location: [facility] ENDOSCOPY;  Service: Endoscopy;  Laterality: N/A;    SOCIAL HISTORY:   Social History   Tobacco Use  . Smoking status: Not on file  Substance Use Topics  . Alcohol use: Not on file    FAMILY HISTORY:   Family History  Problem Relation Age of Onset  . Breast cancer Neg Hx     DRUG ALLERGIES:  No Known Allergies  REVIEW OF SYSTEMS:   ROS As per history of present illness. All pertinent systems were reviewed above. Constitutional,  HEENT, cardiovascular, respiratory, GI, GU, musculoskeletal, neuro, psychiatric, endocrine,  integumentary and hematologic systems were reviewed and are otherwise  negative/unremarkable except for positive findings mentioned above in the HPI.   MEDICATIONS AT HOME:   Prior to Admission medications   Medication Sig Start Date End Date Taking? Authorizing Provider  albuterol (  VENTOLIN HFA) 108 (90 Base) MCG/ACT inhaler Inhale 2 puffs into the lungs every 6 (six) hours as needed for wheezing.   Yes [provider]  albuterol-ipratropium (COMBIVENT) 18-103 MCG/ACT inhaler Inhale 2 puffs into the lungs every 6 (six) hours as needed for wheezing or shortness of breath.   Yes [provider]  aspirin 81 MG tablet Take 81 mg by mouth daily.   Yes [provider]  atorvastatin (LIPITOR) 80 MG tablet Take 80 mg by mouth daily.   Yes  [provider]  cloNIDine (CATAPRES) 0.1 MG tablet Take 0.1 mg by mouth 2 (two) times daily.   Yes [provider]  geriatric multivitamins-minerals (ELDERTONIC/GEVRABON) ELIX Take 15 mLs by mouth daily.   Yes [provider]  glipiZIDE (GLUCOTROL) 10 MG tablet Take 10 mg by mouth daily before breakfast.   Yes [provider]  insulin detemir (LEVEMIR) 100 UNIT/ML injection Inject 20 Units into the skin daily at 6 (six) AM.   Yes [provider]  lisinopril-hydrochlorothiazide (PRINZIDE,ZESTORETIC) 20-25 MG per tablet Take 1 tablet by mouth daily.   Yes [provider]  meloxicam (MOBIC) 7.5 MG tablet Take 7.5 mg by mouth daily.   Yes [provider]  metFORMIN (GLUCOPHAGE) 1000 MG tablet Take 1,000 mg by mouth 2 (two) times daily with a meal.   Yes [provider]  Multiple Vitamin (MULTIVITAMIN) capsule Take 1 capsule by mouth daily.   Yes [provider]      VITAL SIGNS:  Blood pressure (!) 146/46, pulse 90, temperature 100 F (37.8 C), temperature source Oral, resp. rate 19, height 5\' 6"  (1.676 m), weight 104.3 kg, SpO2 96 %.  PHYSICAL EXAMINATION:  Physical Exam  GENERAL:  74 y.o.-year-old African-American female patient lying in the bed with no acute distress.  EYES: Pupils equal, round, reactive to light and accommodation. No scleral icterus. Extraocular muscles intact.  HEENT: Head atraumatic, normocephalic. Oropharynx and nasopharynx clear.  NECK:  Supple, no jugular venous distention. No thyroid enlargement, no tenderness.  LUNGS: Diminished bibasal breath sounds with bibasal crackles. CARDIOVASCULAR: Regular rate and rhythm, S1, S2 normal. No murmurs, rubs, or gallops.  ABDOMEN: Soft, nondistended, nontender. Bowel sounds present. No organomegaly or mass.  EXTREMITIES: No pedal edema, cyanosis, or clubbing.  NEUROLOGIC: Cranial nerves II through XII are intact. Muscle strength 5/5 in all  extremities. Sensation intact. Gait not checked.  PSYCHIATRIC: The patient is alert and oriented x 3.  Normal affect and good eye contact. SKIN: No obvious rash, lesion, or ulcer.   LABORATORY PANEL:   CBC Recent Labs  Lab 01/11/20 1511  WBC 5.4  HGB 12.9  HCT 40.4  PLT 236   ------------------------------------------------------------------------------------------------------------------  Chemistries  Recent Labs  Lab 01/11/20 1511 01/11/20 1632  NA 136  --   K 3.5  --   CL 100  --   CO2 26  --   GLUCOSE 101*  --   BUN 17  --   CREATININE 0.87  --   CALCIUM 8.6*  --   AST  --  53*  ALT  --  25  ALKPHOS  --  61  BILITOT  --  1.1   ------------------------------------------------------------------------------------------------------------------  Cardiac Enzymes No results for input(s): TROPONINI in the last 168 hours. ------------------------------------------------------------------------------------------------------------------  RADIOLOGY:  DG Chest 2 View  Result Date: 01/11/2020 CLINICAL DATA:  Shortness of breath.  COVID positive. EXAM: CHEST - 2 VIEW COMPARISON:  04/12/2008 FINDINGS: The lungs are clear without dense focal  pneumonia, edema, pneumothorax or pleural effusion. Subtle patchy airspace opacity at the left base. The cardiopericardial silhouette is within normal limits for size. Interstitial markings are diffusely coarsened with chronic features. The visualized bony structures of the thorax are intact. IMPRESSION: Subtle focus of patchy airspace opacity at the left lung base. Otherwise unremarkable. Electronically Signed   By: Misty Stanley M.D.   On: 01/11/2020 16:21   CT Angio Chest PE W and/or Wo Contrast  Result Date: 01/11/2020 CLINICAL DATA:  Shortness of breath. COVID positive. EXAM: CT ANGIOGRAPHY CHEST WITH CONTRAST TECHNIQUE: Multidetector CT imaging of the chest was performed using the standard protocol during bolus administration of  intravenous contrast. Multiplanar CT image reconstructions and MIPs were obtained to evaluate the vascular anatomy. CONTRAST:  31mL OMNIPAQUE IOHEXOL 350 MG/ML SOLN COMPARISON:  Standard CT chest 07/18/2006. FINDINGS: Cardiovascular: The heart size is normal. No substantial pericardial effusion. Coronary artery calcification is evident. Atherosclerotic calcification is noted in the wall of the thoracic aorta. No filling defect within the opacified pulmonary arteries to suggest the presence of an acute pulmonary embolus. Mediastinum/Nodes: No mediastinal lymphadenopathy. There is no hilar lymphadenopathy. The esophagus has normal imaging features. There is no axillary lymphadenopathy. Lungs/Pleura: Patchy peripherally predominant ground-glass opacities noted with a lower lung predominance. No pleural effusion. Upper Abdomen: Unremarkable Musculoskeletal: No worrisome lytic or sclerotic osseous abnormality. Review of the MIP images confirms the above findings. IMPRESSION: 1. No CT evidence for acute pulmonary embolus. 2. Patchy peripherally predominant ground-glass opacities with a lower lung predominance. Imaging features compatible with multifocal pneumonia in this patient with positive COVID test. 3. Aortic Atherosclerosis (ICD10-I70.0). Electronically Signed   By: Misty Stanley M.D.   On: 01/11/2020 17:33      IMPRESSION AND PLAN:   1.  Pneumonia due to COVID-19. -The patient will be admitted to a medical monitored isolation bed. -She will be started on IV remdesivir. -Given elevated CRP we will start her on IV Decadron. -We will follow her inflammatory markers. -She has no current need for O2.  Pulse ox symmetry will be followed. -She has elevated fibrin derivatives D-dimer secondary to COVID-19 with no evidence for PE.  2.  Atrial fibrillation with rapid ventricular response. -This has spontaneously converted with IV hydration. -The patient CHA2DS2-VASc score is 4 though.  We will therefore  start her on p.o. Eliquis. -We will obtain a 2D echo in a.m. -We will obtain a cardiology consultation in a.m.  I notified Dr. Neoma Laming. -We will optimize potassium and check magnesium level. -Her TSH was unremarkable.  3.  Hypertension. -We will continue her antihypertensives.  4.  Dyslipidemia. -We will continue her statin therapy.  5.  Type 2 diabetes mellitus. -We will continue her Glucotrol and place on supplemental coverage with NovoLog.  Basal coverage will be continued with Levemir.  6.  DVT prophylaxis. -The patient will be placed on Eliquis.   All the records are reviewed and case discussed with ED provider. The plan of care was discussed in details with the patient (and family). I answered all questions. The patient agreed to proceed with the above mentioned plan. Further management will depend upon hospital course.   CODE STATUS: Full code  TOTAL TIME TAKING CARE OF THIS PATIENT: 55 minutes.    Christel Mormon M.D on 01/11/2020 at 9:44 PM  [facility]   From 7 PM-7 AM, contact night-coverage [email or web address]  CC: Primary care physician; Denton Lank, MD   Note: This dictation was prepared with Dragon  dictation along with smaller phrase technology. Any transcriptional errors that result from this process are unintentional.

## 2020-01-11 NOTE — ED Provider Notes (Signed)
[facility] Emergency Department Provider Note  ____________________________________________   First MD Initiated Contact with Patient 01/11/20 1608     (approximate)  I have reviewed the triage vital signs and the nursing notes.   HISTORY  Chief Complaint Shortness of Breath and COVID    HPI Gloria Blair is a 74 y.o. female with diabetes, hypertension who was Covid positive on 2/12 who comes in for shortness of breath, chest pain, just not feeling well.  Patient states that she was diagnosed at work.  She does not have a positive result on her phone or in her chart.  She states that she started to have some worsening fatigue, shortness of breath and chest pain that started yesterday that has been intermittent, worse with exertion, better at rest.  She states she just has not been feeling well.  Denies any abdominal pain.  Denies any leg swelling.        Past Medical History:  Diagnosis Date  . Asthma   . Diabetes mellitus without complication ([place])   . GERD (gastroesophageal reflux disease)   . Hypertension   . Obesity     There are no problems to display for this patient.   Past Surgical History:  Procedure Laterality Date  . COLONOSCOPY WITH PROPOFOL N/A 08/07/2015   Procedure: COLONOSCOPY WITH PROPOFOL;  Surgeon: Josefine Class, MD;  Location[facility] ENDOSCOPY;  Service: Endoscopy;  Laterality: N/A;    Prior to Admission medications   Medication Sig Start Date End Date Taking? Authorizing Provider  albuterol-ipratropium (COMBIVENT) 18-103 MCG/ACT inhaler Inhale 2 puffs into the lungs every 6 (six) hours as needed for wheezing or shortness of breath.    [provider]  aspirin 81 MG tablet Take 81 mg by mouth daily.    [provider]  atorvastatin (LIPITOR) 80 MG tablet Take 80 mg by mouth daily.    [provider]  cloNIDine (CATAPRES) 0.1 MG tablet Take 0.1 mg by mouth 2 (two) times daily.    [provider]  geriatric multivitamins-minerals (ELDERTONIC/GEVRABON) ELIX Take 15 mLs by mouth daily.    [provider]  glipiZIDE (GLUCOTROL) 10 MG tablet Take 10 mg by mouth daily before breakfast.    [provider]  insulin detemir (LEVEMIR) 100 UNIT/ML injection Inject into the skin at bedtime.    [provider]  lisinopril-hydrochlorothiazide (PRINZIDE,ZESTORETIC) 20-25 MG per tablet Take 1 tablet by mouth daily.    [provider]  meloxicam (MOBIC) 7.5 MG tablet Take 7.5 mg by mouth daily.    [provider]  metFORMIN (GLUCOPHAGE) 1000 MG tablet Take 1,000 mg by mouth 2 (two) times daily with a meal.    [provider]  Multiple Vitamin (MULTIVITAMIN) capsule Take 1 capsule by mouth daily.    [provider]    Allergies Patient has no known allergies.  Family History  Problem Relation Age of Onset  . Breast cancer Neg Hx     Social History Social History   Tobacco Use  . Smoking status: Not on file  Substance Use Topics  . Alcohol use: Not on file  . Drug use: Not on file      Review of Systems Constitutional: Positive fevers, positive just not feeling well Eyes: No visual changes. ENT: No sore throat. Cardiovascular: Positive chest pain Respiratory: Positive for SOB Gastrointestinal: No abdominal pain.  No nausea, no vomiting.  No diarrhea.  No constipation. Genitourinary: Negative for dysuria. Musculoskeletal: Negative  for back pain. Skin: Negative for rash. Neurological: Negative for headaches, focal weakness or numbness. All other ROS negative ____________________________________________   PHYSICAL EXAM:  VITAL SIGNS: ED Triage Vitals  Enc Vitals Group     BP 01/11/20 1507 (!) 153/90     Pulse Rate 01/11/20 1508 92     Resp 01/11/20 1507 (!) 22     Temp 01/11/20 1507 100 F (37.8 C)     Temp Source 01/11/20 1507 Oral     SpO2 01/11/20 1507 96 %     Weight 01/11/20 1508 230 lb  (104.3 kg)     Height 01/11/20 1508 5\' 6"  (1.676 m)     Head Circumference --      Peak Flow --      Pain Score 01/11/20 1508 0     Pain Loc --      Pain Edu? --      Excl. in [place]? --     Constitutional: Alert and oriented. Well appearing and in no acute distress. Eyes: Conjunctivae are normal. EOMI. Head: Atraumatic. Nose: No congestion/rhinnorhea. Mouth/Throat: Mucous membranes are moist.   Neck: No stridor. Trachea Midline. FROM Cardiovascular: Tachycardic, regular rhythm. Grossly normal heart sounds.  Good peripheral circulation. Respiratory: Clear lungs with no increased work of breathing Gastrointestinal: Soft and nontender. No distention. No abdominal bruits.  Musculoskeletal: No lower extremity tenderness nor edema.  No joint effusions. Neurologic:  Normal speech and language. No gross focal neurologic deficits are appreciated.  Skin:  Skin is warm, dry and intact. No rash noted. Psychiatric: Mood and affect are normal. Speech and behavior are normal. GU: Deferred   ____________________________________________   LABS (all labs ordered are listed, but only abnormal results are displayed)  Labs Reviewed  CBC - Abnormal; Notable for the following components:      Result Value   RDW 16.2 (*)    All other components within normal limits  BASIC METABOLIC PANEL - Abnormal; Notable for the following components:   Glucose, Bld 101 (*)    Calcium 8.6 (*)    All other components within normal limits  C-REACTIVE PROTEIN - Abnormal; Notable for the following components:   CRP 3.5 (*)    All other components within normal limits  HEPATIC FUNCTION PANEL - Abnormal; Notable for the following components:   Total Protein 8.2 (*)    AST 53 (*)    Bilirubin, Direct 0.4 (*)    All other components within normal limits  FIBRIN DERIVATIVES D-DIMER ([facility] ONLY) - Abnormal; Notable for the following components:   Fibrin derivatives D-dimer ([facility]) 965.38 (*)    All other components  within normal limits  FIBRINOGEN - Abnormal; Notable for the following components:   Fibrinogen 531 (*)    All other components within normal limits  POC SARS CORONAVIRUS 2 AG - Abnormal; Notable for the following components:   SARS Coronavirus 2 Ag POSITIVE (*)    All other components within normal limits  CULTURE, BLOOD (ROUTINE X 2)  CULTURE, BLOOD (ROUTINE X 2)  LACTIC ACID, PLASMA  PROCALCITONIN  LACTATE DEHYDROGENASE  FERRITIN  TRIGLYCERIDES  TSH  T4, FREE  CBC WITH DIFFERENTIAL/PLATELET  C-REACTIVE PROTEIN  COMPREHENSIVE METABOLIC PANEL  FIBRIN DERIVATIVES D-DIMER ([facility] ONLY)  FERRITIN  PROCALCITONIN  MAGNESIUM  POC SARS CORONAVIRUS 2 AG -  ED  ABO/RH   ____________________________________________   ED ECG REPORT I, Vanessa [place], the attending physician, personally viewed and interpreted this ECG.  EKG is atrial fibrillation rate  of 145, no ST elevation, T wave inversions and V4 through V6, normal intervals  Repeat EKG sinus rhythm 97, no ST elevation, T wave inversion in lead III, aVF, normal intervals ____________________________________________  RADIOLOGY Robert Bellow, personally viewed and evaluated these images (plain radiographs) as part of my medical decision making, as well as reviewing the written report by the radiologist.  ED MD interpretation: Opacity in the left lung base  Official radiology report(s): DG Chest 2 View  Result Date: 01/11/2020 CLINICAL DATA:  Shortness of breath.  COVID positive. EXAM: CHEST - 2 VIEW COMPARISON:  04/12/2008 FINDINGS: The lungs are clear without dense focal pneumonia, edema, pneumothorax or pleural effusion. Subtle patchy airspace opacity at the left base. The cardiopericardial silhouette is within normal limits for size. Interstitial markings are diffusely coarsened with chronic features. The visualized bony structures of the thorax are intact. IMPRESSION: Subtle focus of patchy airspace opacity at the left lung  base. Otherwise unremarkable. Electronically Signed   By: Misty Stanley M.D.   On: 01/11/2020 16:21    ____________________________________________   PROCEDURES  Procedure(s) performed (including Critical Care):  Procedures   ____________________________________________   INITIAL IMPRESSION / ASSESSMENT AND PLAN / ED COURSE   OLAJUMOKE LOBERG was evaluated in Emergency Department on 01/11/2020 for the symptoms described in the history of present illness. She was evaluated in the context of the global COVID-19 pandemic, which necessitated consideration that the patient might be at risk for infection with the SARS-CoV-2 virus that causes COVID-19. Institutional protocols and algorithms that pertain to the evaluation of patients at risk for COVID-19 are in a state of rapid change based on information released by regulatory bodies including the CDC and federal and state organizations. These policies and algorithms were followed during the patient's care in the ED.     Pt presents with SOB.  Patient with known Covid so this is most likely the cause.  Patient meets sepsis criteria but I do not think she is septic given she has Covid.  Will get procalcitonin to evaluate for bacterial infection on top of Covid.  Patient was initially in A. fib with RVR in triage but with fluids converted back to sinus rhythm.  She denies having a history of this.  However upon my assessment patient was back in normal sinus.  Given this in the setting of Covid will get a CT PE to evaluate for pulmonary embolism.  PNA-will get xray to evaluation Anemia-CBC to evaluate ACS- will get trops Arrhythmia-Will get EKG and keep on monitor.  COVID-we will need to retest given no Covid test on file.  Labs are reassuring.  CT PE was negative.  She did have evidence of Covid pneumonia.  Patient is not hypoxic however given the new A. fib with RVR I discussed with family preference of being discharged versus admission for  cardiac monitoring, echo, remdesivir treatment.  Family would prefer to be admitted.  Patient states that she feels pretty weak.  Will discuss to the hospital team for admission   ____________________________________________   FINAL CLINICAL IMPRESSION(S) / ED DIAGNOSES   Final diagnoses:  COVID-19  Atrial fibrillation with rapid ventricular response ([place])     MEDICATIONS GIVEN DURING THIS VISIT:  Medications  remdesivir 200 mg in sodium chloride 0.9% 250 mL IVPB (has no administration in time range)    Followed by  remdesivir 100 mg in sodium chloride 0.9 % 100 mL IVPB (has no administration in time range)  aspirin tablet 81  mg (has no administration in time range)  atorvastatin (LIPITOR) tablet 80 mg (has no administration in time range)  cloNIDine (CATAPRES) tablet 0.1 mg (has no administration in time range)  lisinopril-hydrochlorothiazide (ZESTORETIC) 20-25 MG per tablet 1 tablet (has no administration in time range)  glipiZIDE (GLUCOTROL) tablet 10 mg (has no administration in time range)  insulin detemir (LEVEMIR) injection 20 Units (has no administration in time range)  geriatric multivitamins-minerals (ELDERTONIC/GEVRABON) elixir ELIX 15 mL (has no administration in time range)  multivitamin capsule 1 capsule (has no administration in time range)  albuterol (VENTOLIN HFA) 108 (90 Base) MCG/ACT inhaler 2 puff (has no administration in time range)  albuterol-ipratropium (COMBIVENT) inhaler 2 puff (has no administration in time range)  0.9 %  sodium chloride infusion (has no administration in time range)  guaiFENesin-dextromethorphan (ROBITUSSIN DM) 100-10 MG/5ML syrup 10 mL (has no administration in time range)  chlorpheniramine-HYDROcodone (TUSSIONEX) 10-8 MG/5ML suspension 5 mL (has no administration in time range)  famotidine (PEPCID) tablet 20 mg (has no administration in time range)  acetaminophen (TYLENOL) tablet 650 mg (has no administration in time range)  traZODone  (DESYREL) tablet 25 mg (has no administration in time range)  magnesium hydroxide (MILK OF MAGNESIA) suspension 30 mL (has no administration in time range)  ondansetron (ZOFRAN) tablet 4 mg (has no administration in time range)    Or  ondansetron (ZOFRAN) injection 4 mg (has no administration in time range)  aspirin EC tablet 81 mg (has no administration in time range)  cholecalciferol (VITAMIN D3) tablet 1,000 Units (has no administration in time range)  apixaban (ELIQUIS) tablet 5 mg (has no administration in time range)  potassium chloride (KLOR-CON) packet 40 mEq (has no administration in time range)  acetaminophen (TYLENOL) tablet 1,000 mg (1,000 mg Oral Given 01/11/20 1725)  sodium chloride 0.9 % bolus 500 mL (0 mLs Intravenous Stopped 01/11/20 1930)  iohexol (OMNIPAQUE) 350 MG/ML injection 75 mL (75 mLs Intravenous Contrast Given 01/11/20 1716)     ED Discharge Orders    None       Note:  This document was prepared using Dragon voice recognition software and may include unintentional dictation errors.   Vanessa [place], MD 01/11/20 2055

## 2020-01-12 DIAGNOSIS — U071 COVID-19: Principal | ICD-10-CM

## 2020-01-12 DIAGNOSIS — I480 Paroxysmal atrial fibrillation: Secondary | ICD-10-CM

## 2020-01-12 DIAGNOSIS — J1282 Pneumonia due to coronavirus disease 2019: Secondary | ICD-10-CM

## 2020-01-12 LAB — COMPREHENSIVE METABOLIC PANEL
ALT: 21 U/L (ref 0–44)
AST: 37 U/L (ref 15–41)
Albumin: 2.8 g/dL — ABNORMAL LOW (ref 3.5–5.0)
Alkaline Phosphatase: 54 U/L (ref 38–126)
Anion gap: 10 (ref 5–15)
BUN: 16 mg/dL (ref 8–23)
CO2: 25 mmol/L (ref 22–32)
Calcium: 7.9 mg/dL — ABNORMAL LOW (ref 8.9–10.3)
Chloride: 101 mmol/L (ref 98–111)
Creatinine, Ser: 0.81 mg/dL (ref 0.44–1.00)
GFR calc Af Amer: >60 mL/min (ref >60)
GFR calc non Af Amer: >60 mL/min (ref >60)
Glucose, Bld: 107 mg/dL — ABNORMAL HIGH (ref 70–99)
Potassium: 3.4 mmol/L — ABNORMAL LOW (ref 3.5–5.1)
Sodium: 136 mmol/L (ref 135–145)
Total Bilirubin: 0.6 mg/dL (ref 0.3–1.2)
Total Protein: 6.9 g/dL (ref 6.5–8.1)

## 2020-01-12 LAB — CBC WITH DIFFERENTIAL/PLATELET
Abs Immature Granulocytes: 0.02 10*3/uL (ref 0.00–0.07)
Basophils Absolute: 0 10*3/uL (ref 0.0–0.1)
Basophils Relative: 0 %
Eosinophils Absolute: 0 10*3/uL (ref 0.0–0.5)
Eosinophils Relative: 0 %
HCT: 35.7 % — ABNORMAL LOW (ref 36.0–46.0)
Hemoglobin: 11.4 g/dL — ABNORMAL LOW (ref 12.0–15.0)
Immature Granulocytes: 0 %
Lymphocytes Relative: 5 %
Lymphs Abs: 0.3 10*3/uL — ABNORMAL LOW (ref 0.7–4.0)
MCH: 25.9 pg — ABNORMAL LOW (ref 26.0–34.0)
MCHC: 31.9 g/dL (ref 30.0–36.0)
MCV: 81.1 fL (ref 80.0–100.0)
Monocytes Absolute: 0.3 10*3/uL (ref 0.1–1.0)
Monocytes Relative: 5 %
Neutro Abs: 4.9 10*3/uL (ref 1.7–7.7)
Neutrophils Relative %: 90 %
Platelets: 186 10*3/uL (ref 150–400)
RBC: 4.4 MIL/uL (ref 3.87–5.11)
RDW: 16.2 % — ABNORMAL HIGH (ref 11.5–15.5)
WBC: 5.4 10*3/uL (ref 4.0–10.5)
nRBC: 0 % (ref 0.0–0.2)

## 2020-01-12 LAB — HEMOGLOBIN A1C
Hgb A1c MFr Bld: 7.7 % — ABNORMAL HIGH (ref 4.8–5.6)
Mean Plasma Glucose: 174.29 mg/dL

## 2020-01-12 LAB — FIBRIN DERIVATIVES D-DIMER (ARMC ONLY): Fibrin derivatives D-dimer (ARMC): 1111.8 ng/mL (FEU) — ABNORMAL HIGH (ref 0.00–499.00)

## 2020-01-12 LAB — GLUCOSE, CAPILLARY
Glucose-Capillary: 141 mg/dL — ABNORMAL HIGH (ref 70–99)
Glucose-Capillary: 148 mg/dL — ABNORMAL HIGH (ref 70–99)
Glucose-Capillary: 64 mg/dL — ABNORMAL LOW (ref 70–99)
Glucose-Capillary: 50 mg/dL — ABNORMAL LOW (ref 70–99)
Glucose-Capillary: 98 mg/dL (ref 70–99)

## 2020-01-12 LAB — C-REACTIVE PROTEIN: CRP: 4.4 mg/dL — ABNORMAL HIGH (ref <1.0)

## 2020-01-12 LAB — PROCALCITONIN: Procalcitonin: <0.1 ng/mL

## 2020-01-12 LAB — FERRITIN: Ferritin: 217 ng/mL (ref 11–307)

## 2020-01-12 NOTE — Progress Notes (Signed)
Notified E. Ouma, NP of Mag 1.5. No new orders given.

## 2020-01-12 NOTE — Progress Notes (Signed)
Hypoglycemic standing  protocol initiated. CBG of 50. Juice provided. Pt requested potato chips; provided. Recheck of CBG 98. Pt A&Ox4, Dry skin. No complaints or concerns expressed. Will continue to monitor.

## 2020-01-12 NOTE — Progress Notes (Signed)
[place] [facility] [place] NAME: Gloria Blair    MR#:  [id number]  DATE OF BIRTH:  03-19-1946  SUBJECTIVE:  CHIEF COMPLAINT:   Chief Complaint  Patient presents with  . Shortness of Breath  . COVID  Feeling better.  Not short of breath but feeling weak, heart rate much better control REVIEW OF SYSTEMS:  Review of Systems  Constitutional: Positive for malaise/fatigue. Negative for diaphoresis, fever and weight loss.  HENT: Negative for ear discharge, ear pain, hearing loss, nosebleeds, sore throat and tinnitus.   Eyes: Negative for blurred vision and pain.  Respiratory: Negative for cough, hemoptysis, shortness of breath and wheezing.   Cardiovascular: Negative for chest pain, palpitations, orthopnea and leg swelling.  Gastrointestinal: Negative for abdominal pain, blood in stool, constipation, diarrhea, heartburn, nausea and vomiting.  Genitourinary: Negative for dysuria, frequency and urgency.  Musculoskeletal: Negative for back pain and myalgias.  Skin: Negative for itching and rash.  Neurological: Negative for dizziness, tingling, tremors, focal weakness, seizures, weakness and headaches.  Psychiatric/Behavioral: Negative for depression. The patient is not nervous/anxious.     DRUG ALLERGIES:  No Known Allergies VITALS:  Blood pressure (!) 137/51, pulse 74, temperature 97.9 F (36.6 C), temperature source Oral, resp. rate (!) 23, height 5\' 6"  (1.676 m), weight 104.3 kg, SpO2 95 %. PHYSICAL EXAMINATION:  Physical Exam HENT:     Head: Normocephalic and atraumatic.  Eyes:     Conjunctiva/sclera: Conjunctivae normal.     Pupils: Pupils are equal, round, and reactive to light.  Neck:     Thyroid: No thyromegaly.     Trachea: No tracheal deviation.  Cardiovascular:     Rate and Rhythm: Normal rate and regular rhythm.     Heart sounds: Normal heart sounds.  Pulmonary:     Effort: Pulmonary effort is normal. No respiratory distress.     Breath sounds:  Normal breath sounds. No wheezing.  Chest:     Chest wall: No tenderness.  Abdominal:     General: Bowel sounds are normal. There is no distension.     Palpations: Abdomen is soft.     Tenderness: There is no abdominal tenderness.  Musculoskeletal:        General: Normal range of motion.     Cervical back: Normal range of motion and neck supple.  Skin:    General: Skin is warm and dry.     Findings: No rash.  Neurological:     Mental Status: She is alert and oriented to person, place, and time.     Cranial Nerves: No cranial nerve deficit.    LABORATORY PANEL:  Female CBC Recent Labs  Lab 01/12/20 0239  WBC 5.4  HGB 11.4*  HCT 35.7*  PLT 186   ------------------------------------------------------------------------------------------------------------------ Chemistries  Recent Labs  Lab 01/11/20 1511 01/11/20 2245 01/12/20 0239  NA   < >  --  136  K   < >  --  3.4*  CL   < >  --  101  CO2   < >  --  25  GLUCOSE   < >  --  107*  BUN   < >  --  16  CREATININE   < >  --  0.81  CALCIUM   < >  --  7.9*  MG  --  1.5*  --   AST   < >  --  37  ALT   < >  --  21  ALKPHOS   < >  --  54  BILITOT   < >  --  0.6   < > = values in this interval not displayed.   RADIOLOGY:  DG Chest 2 View  Result Date: 01/11/2020 CLINICAL DATA:  Shortness of breath.  COVID positive. EXAM: CHEST - 2 VIEW COMPARISON:  04/12/2008 FINDINGS: The lungs are clear without dense focal pneumonia, edema, pneumothorax or pleural effusion. Subtle patchy airspace opacity at the left base. The cardiopericardial silhouette is within normal limits for size. Interstitial markings are diffusely coarsened with chronic features. The visualized bony structures of the thorax are intact. IMPRESSION: Subtle focus of patchy airspace opacity at the left lung base. Otherwise unremarkable. Electronically Signed   By: Misty Stanley M.D.   On: 01/11/2020 16:21   CT Angio Chest PE W and/or Wo Contrast  Result Date:  01/11/2020 CLINICAL DATA:  Shortness of breath. COVID positive. EXAM: CT ANGIOGRAPHY CHEST WITH CONTRAST TECHNIQUE: Multidetector CT imaging of the chest was performed using the standard protocol during bolus administration of intravenous contrast. Multiplanar CT image reconstructions and MIPs were obtained to evaluate the vascular anatomy. CONTRAST:  55mL OMNIPAQUE IOHEXOL 350 MG/ML SOLN COMPARISON:  Standard CT chest 07/18/2006. FINDINGS: Cardiovascular: The heart size is normal. No substantial pericardial effusion. Coronary artery calcification is evident. Atherosclerotic calcification is noted in the wall of the thoracic aorta. No filling defect within the opacified pulmonary arteries to suggest the presence of an acute pulmonary embolus. Mediastinum/Nodes: No mediastinal lymphadenopathy. There is no hilar lymphadenopathy. The esophagus has normal imaging features. There is no axillary lymphadenopathy. Lungs/Pleura: Patchy peripherally predominant ground-glass opacities noted with a lower lung predominance. No pleural effusion. Upper Abdomen: Unremarkable Musculoskeletal: No worrisome lytic or sclerotic osseous abnormality. Review of the MIP images confirms the above findings. IMPRESSION: 1. No CT evidence for acute pulmonary embolus. 2. Patchy peripherally predominant ground-glass opacities with a lower lung predominance. Imaging features compatible with multifocal pneumonia in this patient with positive COVID test. 3. Aortic Atherosclerosis (ICD10-I70.0). Electronically Signed   By: Misty Stanley M.D.   On: 01/11/2020 17:33   ASSESSMENT AND PLAN:  Gloria Blair  is a 74 y.o. African-American female with a known history of asthma, type diabetes mellitus, hypertension and GERD as well as recent COVID-46 diagnosed about 5 days ago, who presented to the emergency room with acute onset of generalized weakness that started yesterday with mild dry cough  1.  Pneumonia due to COVID-19. -Continue vitamin C and  zinc.  I will stop remdesivir as she is not requiring any oxygen. -Given elevated CRP continue IV Decadron. -We will follow her inflammatory markers. -She has no current need for O2.  Pulse ox symmetry will be followed. -She has elevated fibrin derivatives D-dimer secondary to COVID-19 with no evidence for PE.  2.  Atrial fibrillation with rapid ventricular response. -This has spontaneously converted with IV hydration. -The patient CHA2DS2-VASc score is 4 though.    Continue p.o. Eliquis. -Pending 2D echo  -Dr. Neoma Laming recommends no further evaluation - optimize potassium and check magnesium level. -Her TSH was unremarkable.  3.  Hypertension. - continue her antihypertensives.  4.  Dyslipidemia. -continue her statin therapy.  5.  Type 2 diabetes mellitus. - continue her Glucotrol and place on supplemental coverage with NovoLog.  Basal coverage will be continued with Levemir.  6.  DVT prophylaxis. - on Eliquis.  She is ambulating without any difficulty per nursing   DVT prophylaxis: On Eliquis Family Communication: Discussed with patient's son Beverely Low over phone at [phone]  Disposition Plan:  Came from home Discharge back home Barriers to DC -none  All the records are reviewed and case discussed with Care Management/Social Worker. Management plans discussed with the patient, family (son over phone) and they are in agreement.  CODE STATUS: Full Code  TOTAL TIME TAKING CARE OF THIS PATIENT: 35 minutes.   More than 50% of the time was spent in counseling/coordination of care: YES  POSSIBLE D/C IN 1 DAYS, DEPENDING ON CLINICAL CONDITION.   Max Sane M.D on 01/12/2020 at 3:19 PM  [facility]   CC: Primary care physician; Denton Lank, MD  Note: This dictation was prepared with Dragon dictation along with smaller phrase technology. Any transcriptional errors that result from this process are unintentional.

## 2020-01-12 NOTE — Consult Note (Signed)
Gloria Blair is a 74 y.o. female  [id number]  Primary Cardiologist: Neoma Laming Reason for Consultation: A fib RVR  HPI:  This is a 74 year old female with history of Asthma, DMII, Hypertension, and GERD. Patient presented to the ED with increased malaise and a cough. Upon arrival patient was found to be in A fib with RVR. Patient converted to NSR after IV fluid bolus. Patient has already been started on anticoagulation and currently with stable vitals.     Review of Systems: Denies angina, dyspnea, orthopnea, or PND   Past Medical History:  Diagnosis Date  . Asthma   . Diabetes mellitus without complication ([place])   . GERD (gastroesophageal reflux disease)   . Hypertension   . Obesity     Medications Prior to Admission  Medication Sig Dispense Refill  . albuterol (VENTOLIN HFA) 108 (90 Base) MCG/ACT inhaler Inhale 2 puffs into the lungs every 6 (six) hours as needed for wheezing.    Marland Kitchen albuterol-ipratropium (COMBIVENT) 18-103 MCG/ACT inhaler Inhale 2 puffs into the lungs every 6 (six) hours as needed for wheezing or shortness of breath.    Marland Kitchen aspirin 81 MG tablet Take 81 mg by mouth daily.    Marland Kitchen atorvastatin (LIPITOR) 80 MG tablet Take 80 mg by mouth daily.    . cloNIDine (CATAPRES) 0.1 MG tablet Take 0.1 mg by mouth 2 (two) times daily.    Marland Kitchen geriatric multivitamins-minerals (ELDERTONIC/GEVRABON) ELIX Take 15 mLs by mouth daily.    Marland Kitchen glipiZIDE (GLUCOTROL) 10 MG tablet Take 10 mg by mouth daily before breakfast.    . insulin detemir (LEVEMIR) 100 UNIT/ML injection Inject 20 Units into the skin daily at 6 (six) AM.    . lisinopril-hydrochlorothiazide (PRINZIDE,ZESTORETIC) 20-25 MG per tablet Take 1 tablet by mouth daily.    . meloxicam (MOBIC) 7.5 MG tablet Take 7.5 mg by mouth daily.    . metFORMIN (GLUCOPHAGE) 1000 MG tablet Take 1,000 mg by mouth 2 (two) times daily with a meal.    . Multiple Vitamin (MULTIVITAMIN) capsule Take 1 capsule by mouth daily.       Marland Kitchen apixaban  5  mg Oral BID  . aspirin EC  81 mg Oral Daily  . atorvastatin  80 mg Oral Daily  . cholecalciferol  1,000 Units Oral Daily  . cloNIDine  0.1 mg Oral BID  . dexamethasone (DECADRON) injection  6 mg Intravenous Q24H  . famotidine  20 mg Oral BID  . glipiZIDE  10 mg Oral QAC breakfast  . lisinopril  20 mg Oral Daily   And  . hydrochlorothiazide  25 mg Oral Daily  . insulin aspart  0-15 Units Subcutaneous TID PC & HS  . insulin detemir  20 Units Subcutaneous Q0600  . multivitamin with minerals  1 tablet Oral Daily    Infusions: . sodium chloride 100 mL/hr at 01/12/20 1031    No Known Allergies  Social History   Socioeconomic History  . Marital status: Widowed    Spouse name: Not on file  . Number of children: Not on file  . Years of education: Not on file  . Highest education level: Not on file  Occupational History  . Not on file  Tobacco Use  . Smoking status: Never Smoker  . Smokeless tobacco: Never Used  Substance and Sexual Activity  . Alcohol use: Not on file  . Drug use: Not on file  . Sexual activity: Not on file  Other Topics Concern  .  Not on file  Social History Narrative  . Not on file   Social Determinants of Health   Financial Resource Strain:   . Difficulty of Paying Living Expenses: Not on file  Food Insecurity:   . Worried About Charity fundraiser in the Last Year: Not on file  . Ran Out of Food in the Last Year: Not on file  Transportation Needs:   . Lack of Transportation (Medical): Not on file  . Lack of Transportation (Non-Medical): Not on file  Physical Activity:   . Days of Exercise per Week: Not on file  . Minutes of Exercise per Session: Not on file  Stress:   . Feeling of Stress : Not on file  Social Connections:   . Frequency of Communication with Friends and Family: Not on file  . Frequency of Social Gatherings with Friends and Family: Not on file  . Attends Religious Services: Not on file  . Active Member of Clubs or  Organizations: Not on file  . Attends Archivist Meetings: Not on file  . Marital Status: Not on file  Intimate Partner Violence:   . Fear of Current or Ex-Partner: Not on file  . Emotionally Abused: Not on file  . Physically Abused: Not on file  . Sexually Abused: Not on file    Family History  Problem Relation Age of Onset  . Breast cancer Neg Hx     PHYSICAL EXAM: Vitals:   01/12/20 0429 01/12/20 0600  BP: (!) 137/43 (!) 137/51  Pulse: 77 74  Resp: (!) 24 (!) 23  Temp:  97.9 F (36.6 C)  SpO2: 94% 95%     Intake/Output Summary (Last 24 hours) at 01/12/2020 1557 Last data filed at 01/12/2020 1300 Gross per 24 hour  Intake 1995.68 ml  Output --  Net 1995.68 ml    General:  Well appearing. No respiratory difficulty HEENT: normal Neck: supple. no JVD. Carotids 2+ bilat; no bruits. No lymphadenopathy or thryomegaly appreciated. Cor: PMI nondisplaced. Regular rate & rhythm. No rubs, gallops or murmurs. Lungs: clear Abdomen: soft, nontender, nondistended. No hepatosplenomegaly. No bruits or masses. Good bowel sounds. Extremities: no cyanosis, clubbing, rash, edema Neuro: alert & oriented x 3, cranial nerves grossly intact. moves all 4 extremities w/o difficulty. Affect pleasant.  ECG: NSR with non-specific ST-T changes. HR 97  Results for orders placed or performed during the hospital encounter of 01/11/20 (from the past 24 hour(s))  Lactic acid, plasma     Status: None   Collection Time: 01/11/20  4:13 PM  Result Value Ref Range   Lactic Acid, Venous 1.7 0.5 - 1.9 mmol/L  Procalcitonin     Status: None   Collection Time: 01/11/20  4:13 PM  Result Value Ref Range   Procalcitonin <0.10 ng/mL  Lactate dehydrogenase     Status: None   Collection Time: 01/11/20  4:13 PM  Result Value Ref Range   LDH 192 98 - 192 U/L  Ferritin     Status: None   Collection Time: 01/11/20  4:13 PM  Result Value Ref Range   Ferritin 201 11 - 307 ng/mL  Triglycerides      Status: None   Collection Time: 01/11/20  4:13 PM  Result Value Ref Range   Triglycerides 93 <150 mg/dL  C-reactive protein     Status: Abnormal   Collection Time: 01/11/20  4:13 PM  Result Value Ref Range   CRP 3.5 (H) <1.0 mg/dL  Blood Culture (routine x  2)     Status: None (Preliminary result)   Collection Time: 01/11/20  4:18 PM   Specimen: BLOOD  Result Value Ref Range   Specimen Description BLOOD LEFT ANTECUBITAL    Special Requests      BOTTLES DRAWN AEROBIC AND ANAEROBIC Blood Culture results may not be optimal due to an inadequate volume of blood received in culture bottles   Culture      NO GROWTH < 24 HOURS Performed at [facility], [address] [place], [place] [address]    Report Status PENDING   TSH     Status: None   Collection Time: 01/11/20  4:19 PM  Result Value Ref Range   TSH 0.423 0.350 - 4.500 uIU/mL  T4, free     Status: None   Collection Time: 01/11/20  4:19 PM  Result Value Ref Range   Free T4 1.10 0.61 - 1.12 ng/dL  Hepatic function panel     Status: Abnormal   Collection Time: 01/11/20  4:32 PM  Result Value Ref Range   Total Protein 8.2 (H) 6.5 - 8.1 g/dL   Albumin 3.5 3.5 - 5.0 g/dL   AST 53 (H) 15 - 41 U/L   ALT 25 0 - 44 U/L   Alkaline Phosphatase 61 38 - 126 U/L   Total Bilirubin 1.1 0.3 - 1.2 mg/dL   Bilirubin, Direct 0.4 (H) 0.0 - 0.2 mg/dL   Indirect Bilirubin 0.7 0.3 - 0.9 mg/dL  Fibrin derivatives D-Dimer ([facility] only)     Status: Abnormal   Collection Time: 01/11/20  6:10 PM  Result Value Ref Range   Fibrin derivatives D-dimer ([facility]) 965.38 (H) 0.00 - 499.00 ng/mL (FEU)  Fibrinogen     Status: Abnormal   Collection Time: 01/11/20  6:10 PM  Result Value Ref Range   Fibrinogen 531 (H) 210 - 475 mg/dL  POC SARS Coronavirus 2 Ag     Status: Abnormal   Collection Time: 01/11/20  7:53 PM  Result Value Ref Range   SARS Coronavirus 2 Ag POSITIVE (A) NEGATIVE  Blood Culture (routine x 2)     Status: None (Preliminary result)    Collection Time: 01/11/20 10:43 PM   Specimen: BLOOD  Result Value Ref Range   Specimen Description BLOOD RIGHT ANTECUBITAL    Special Requests      BOTTLES DRAWN AEROBIC AND ANAEROBIC Blood Culture adequate volume   Culture      NO GROWTH < 12 HOURS Performed at [facility], [address] [place], [place] [address]    Report Status PENDING   Procalcitonin - Baseline     Status: None   Collection Time: 01/11/20 10:45 PM  Result Value Ref Range   Procalcitonin <0.10 ng/mL  Magnesium     Status: Abnormal   Collection Time: 01/11/20 10:45 PM  Result Value Ref Range   Magnesium 1.5 (L) 1.7 - 2.4 mg/dL  Hemoglobin A1c     Status: Abnormal   Collection Time: 01/11/20 10:45 PM  Result Value Ref Range   Hgb A1c MFr Bld 7.7 (H) 4.8 - 5.6 %   Mean Plasma Glucose 174.29 mg/dL  CBC with Differential/Platelet     Status: Abnormal   Collection Time: 01/12/20  2:39 AM  Result Value Ref Range   WBC 5.4 4.0 - 10.5 K/uL   RBC 4.40 3.87 - 5.11 MIL/uL   Hemoglobin 11.4 (L) 12.0 - 15.0 g/dL   HCT 35.7 (L) 36.0 - 46.0 %   MCV  81.1 80.0 - 100.0 fL   MCH 25.9 (L) 26.0 - 34.0 pg   MCHC 31.9 30.0 - 36.0 g/dL   RDW 16.2 (H) 11.5 - 15.5 %   Platelets 186 150 - 400 K/uL   nRBC 0.0 0.0 - 0.2 %   Neutrophils Relative % 90 %   Neutro Abs 4.9 1.7 - 7.7 K/uL   Lymphocytes Relative 5 %   Lymphs Abs 0.3 (L) 0.7 - 4.0 K/uL   Monocytes Relative 5 %   Monocytes Absolute 0.3 0.1 - 1.0 K/uL   Eosinophils Relative 0 %   Eosinophils Absolute 0.0 0.0 - 0.5 K/uL   Basophils Relative 0 %   Basophils Absolute 0.0 0.0 - 0.1 K/uL   Immature Granulocytes 0 %   Abs Immature Granulocytes 0.02 0.00 - 0.07 K/uL  C-reactive protein     Status: Abnormal   Collection Time: 01/12/20  2:39 AM  Result Value Ref Range   CRP 4.4 (H) <1.0 mg/dL  Comprehensive metabolic panel     Status: Abnormal   Collection Time: 01/12/20  2:39 AM  Result Value Ref Range   Sodium 136 135 - 145 mmol/L   Potassium 3.4 (L)  3.5 - 5.1 mmol/L   Chloride 101 98 - 111 mmol/L   CO2 25 22 - 32 mmol/L   Glucose, Bld 107 (H) 70 - 99 mg/dL   BUN 16 8 - 23 mg/dL   Creatinine, Ser 0.81 0.44 - 1.00 mg/dL   Calcium 7.9 (L) 8.9 - 10.3 mg/dL   Total Protein 6.9 6.5 - 8.1 g/dL   Albumin 2.8 (L) 3.5 - 5.0 g/dL   AST 37 15 - 41 U/L   ALT 21 0 - 44 U/L   Alkaline Phosphatase 54 38 - 126 U/L   Total Bilirubin 0.6 0.3 - 1.2 mg/dL   GFR calc non Af Amer >60 >60 mL/min   GFR calc Af Amer >60 >60 mL/min   Anion gap 10 5 - 15  Fibrin derivatives D-Dimer ([facility] only)     Status: Abnormal   Collection Time: 01/12/20  2:39 AM  Result Value Ref Range   Fibrin derivatives D-dimer ([facility]) 1,111.80 (H) 0.00 - 499.00 ng/mL (FEU)  Ferritin     Status: None   Collection Time: 01/12/20  2:39 AM  Result Value Ref Range   Ferritin 217 11 - 307 ng/mL  Glucose, capillary     Status: Abnormal   Collection Time: 01/12/20  7:29 AM  Result Value Ref Range   Glucose-Capillary 141 (H) 70 - 99 mg/dL  Glucose, capillary     Status: Abnormal   Collection Time: 01/12/20 11:24 AM  Result Value Ref Range   Glucose-Capillary 148 (H) 70 - 99 mg/dL   DG Chest 2 View  Result Date: 01/11/2020 CLINICAL DATA:  Shortness of breath.  COVID positive. EXAM: CHEST - 2 VIEW COMPARISON:  04/12/2008 FINDINGS: The lungs are clear without dense focal pneumonia, edema, pneumothorax or pleural effusion. Subtle patchy airspace opacity at the left base. The cardiopericardial silhouette is within normal limits for size. Interstitial markings are diffusely coarsened with chronic features. The visualized bony structures of the thorax are intact. IMPRESSION: Subtle focus of patchy airspace opacity at the left lung base. Otherwise unremarkable. Electronically Signed   By: Misty Stanley M.D.   On: 01/11/2020 16:21   CT Angio Chest PE W and/or Wo Contrast  Result Date: 01/11/2020 CLINICAL DATA:  Shortness of breath. COVID positive. EXAM: CT ANGIOGRAPHY CHEST WITH CONTRAST  TECHNIQUE: Multidetector CT imaging of the chest was performed using the standard protocol during bolus administration of intravenous contrast. Multiplanar CT image reconstructions and MIPs were obtained to evaluate the vascular anatomy. CONTRAST:  95mL OMNIPAQUE IOHEXOL 350 MG/ML SOLN COMPARISON:  Standard CT chest 07/18/2006. FINDINGS: Cardiovascular: The heart size is normal. No substantial pericardial effusion. Coronary artery calcification is evident. Atherosclerotic calcification is noted in the wall of the thoracic aorta. No filling defect within the opacified pulmonary arteries to suggest the presence of an acute pulmonary embolus. Mediastinum/Nodes: No mediastinal lymphadenopathy. There is no hilar lymphadenopathy. The esophagus has normal imaging features. There is no axillary lymphadenopathy. Lungs/Pleura: Patchy peripherally predominant ground-glass opacities noted with a lower lung predominance. No pleural effusion. Upper Abdomen: Unremarkable Musculoskeletal: No worrisome lytic or sclerotic osseous abnormality. Review of the MIP images confirms the above findings. IMPRESSION: 1. No CT evidence for acute pulmonary embolus. 2. Patchy peripherally predominant ground-glass opacities with a lower lung predominance. Imaging features compatible with multifocal pneumonia in this patient with positive COVID test. 3. Aortic Atherosclerosis (ICD10-I70.0). Electronically Signed   By: Misty Stanley M.D.   On: 01/11/2020 17:33     ASSESSMENT AND PLAN: Patient is doing well and in no acute distress. Patient no longer in A. Fib with RVR and continues to remain in NSR. Chad-Vasc score 4, recommend continuing the patient on anticoagulation with Eliquis 5mg  BID. Hypertension well controlled on current home regimen.   Matelyn Antonelli A

## 2020-01-13 DIAGNOSIS — I4891 Unspecified atrial fibrillation: Secondary | ICD-10-CM

## 2020-01-13 LAB — CBC WITH DIFFERENTIAL/PLATELET
Abs Immature Granulocytes: 0.02 10*3/uL (ref 0.00–0.07)
Basophils Absolute: 0 10*3/uL (ref 0.0–0.1)
Basophils Relative: 0 %
Eosinophils Absolute: 0 10*3/uL (ref 0.0–0.5)
Eosinophils Relative: 0 %
HCT: 36.1 % (ref 36.0–46.0)
Hemoglobin: 11.3 g/dL — ABNORMAL LOW (ref 12.0–15.0)
Immature Granulocytes: 1 %
Lymphocytes Relative: 18 %
Lymphs Abs: 0.6 10*3/uL — ABNORMAL LOW (ref 0.7–4.0)
MCH: 25.9 pg — ABNORMAL LOW (ref 26.0–34.0)
MCHC: 31.3 g/dL (ref 30.0–36.0)
MCV: 82.6 fL (ref 80.0–100.0)
Monocytes Absolute: 0.3 10*3/uL (ref 0.1–1.0)
Monocytes Relative: 8 %
Neutro Abs: 2.7 10*3/uL (ref 1.7–7.7)
Neutrophils Relative %: 73 %
Platelets: 199 10*3/uL (ref 150–400)
RBC: 4.37 MIL/uL (ref 3.87–5.11)
RDW: 16.2 % — ABNORMAL HIGH (ref 11.5–15.5)
WBC: 3.6 10*3/uL — ABNORMAL LOW (ref 4.0–10.5)
nRBC: 0 % (ref 0.0–0.2)

## 2020-01-13 LAB — COMPREHENSIVE METABOLIC PANEL
ALT: 23 U/L (ref 0–44)
AST: 39 U/L (ref 15–41)
Albumin: 2.6 g/dL — ABNORMAL LOW (ref 3.5–5.0)
Alkaline Phosphatase: 52 U/L (ref 38–126)
Anion gap: 6 (ref 5–15)
BUN: 18 mg/dL (ref 8–23)
CO2: 25 mmol/L (ref 22–32)
Calcium: 8 mg/dL — ABNORMAL LOW (ref 8.9–10.3)
Chloride: 106 mmol/L (ref 98–111)
Creatinine, Ser: 0.66 mg/dL (ref 0.44–1.00)
GFR calc Af Amer: >60 mL/min (ref >60)
GFR calc non Af Amer: >60 mL/min (ref >60)
Glucose, Bld: 207 mg/dL — ABNORMAL HIGH (ref 70–99)
Potassium: 4.1 mmol/L (ref 3.5–5.1)
Sodium: 137 mmol/L (ref 135–145)
Total Bilirubin: 0.7 mg/dL (ref 0.3–1.2)
Total Protein: 7 g/dL (ref 6.5–8.1)

## 2020-01-13 LAB — C-REACTIVE PROTEIN: CRP: 3.5 mg/dL — ABNORMAL HIGH (ref <1.0)

## 2020-01-13 LAB — FERRITIN: Ferritin: 348 ng/mL — ABNORMAL HIGH (ref 11–307)

## 2020-01-13 LAB — GLUCOSE, CAPILLARY: Glucose-Capillary: 203 mg/dL — ABNORMAL HIGH (ref 70–99)

## 2020-01-13 LAB — FIBRIN DERIVATIVES D-DIMER (ARMC ONLY): Fibrin derivatives D-dimer (ARMC): 781.92 ng/mL (FEU) — ABNORMAL HIGH (ref 0.00–499.00)

## 2020-01-13 MED ORDER — METOPROLOL SUCCINATE ER 25 MG PO TB24
25.0000 mg | ORAL_TABLET | Freq: Every day | ORAL | Status: DC
  Administered 2020-01-13: 25 mg via ORAL
  Filled 2020-01-13: qty 1

## 2020-01-13 MED ORDER — APIXABAN 5 MG PO TABS: 5.0000 mg | ORAL_TABLET | Freq: Two times a day (BID) | ORAL | 0 refills | Status: DC

## 2020-01-13 MED ORDER — METOPROLOL SUCCINATE ER 25 MG PO TB24: 25.0000 mg | ORAL_TABLET | Freq: Every day | ORAL | 0 refills | Status: DC

## 2020-01-13 MED ORDER — PREDNISONE 10 MG (21) PO TBPK: ORAL_TABLET | ORAL | 0 refills | Status: DC

## 2020-01-13 MED ORDER — VITAMIN C 250 MG PO TABS: 250.0000 mg | ORAL_TABLET | Freq: Every day | ORAL | 0 refills | Status: AC

## 2020-01-13 MED ORDER — ZINC SULFATE 220 (50 ZN) MG PO TABS: 1.0000 | ORAL_TABLET | Freq: Every morning | ORAL | 0 refills | Status: DC

## 2020-01-13 NOTE — Progress Notes (Signed)
Pt being discharged home, discharge instructions reviewed with pt, states understanding, pt with no complaints, awaiting son for transport  

## 2020-01-13 NOTE — Final Consult Note (Signed)
Patient doing well with no complaints of chest pain or dyspnea. Will initiate metoprolol 25mg  daily as patient is hypertensive and for rate control. Will continue to monitor patient's rate and rhythm. Patient has remained in NSR since initially converting from A fib RVR after IV hydration on 2/16. Agree that the patient can be discharged and recommend f/u with patient at [facility] in 2 weeks.   Dionisio David, MD, [facility] 01/13/2020 11:45 AM

## 2020-01-13 NOTE — Discharge Instructions (Signed)
COVID-19 COVID-19 is a respiratory infection that is caused by a virus called severe acute respiratory syndrome coronavirus 2 (SARS-CoV-2). The disease is also known as coronavirus disease or novel coronavirus. In some people, the virus may not cause any symptoms. In others, it may cause a serious infection. The infection can get worse quickly and can lead to complications, such as:  Pneumonia, or infection of the lungs.  Acute respiratory distress syndrome or ARDS. This is a condition in which fluid build-up in the lungs prevents the lungs from filling with air and passing oxygen into the blood.  Acute respiratory failure. This is a condition in which there is not enough oxygen passing from the lungs to the body or when carbon dioxide is not passing from the lungs out of the body.  Sepsis or septic shock. This is a serious bodily reaction to an infection.  Blood clotting problems.  Secondary infections due to bacteria or fungus.  Organ failure. This is when your body's organs stop working. The virus that causes COVID-19 is contagious. This means that it can spread from person to person through droplets from coughs and sneezes (respiratory secretions). What are the causes? This illness is caused by a virus. You may catch the virus by:  Breathing in droplets from an infected person. Droplets can be spread by a person breathing, speaking, singing, coughing, or sneezing.  Touching something, like a table or a doorknob, that was exposed to the virus (contaminated) and then touching your mouth, nose, or eyes. What increases the risk? Risk for infection You are more likely to be infected with this virus if you:  Are within 6 feet (2 meters) of a person with COVID-19.  Provide care for or live with a person who is infected with COVID-19.  Spend time in crowded indoor spaces or live in shared housing. Risk for serious illness You are more likely to become seriously ill from the virus if  you:  Are 50 years of age or older. The higher your age, the more you are at risk for serious illness.  Live in a nursing home or long-term care facility.  Have cancer.  Have a long-term (chronic) disease such as: ? Chronic lung disease, including chronic obstructive pulmonary disease or asthma. ? A long-term disease that lowers your body's ability to fight infection (immunocompromised). ? Heart disease, including heart failure, a condition in which the arteries that lead to the heart become narrow or blocked (coronary artery disease), a disease which makes the heart muscle thick, weak, or stiff (cardiomyopathy). ? Diabetes. ? Chronic kidney disease. ? Sickle cell disease, a condition in which red blood cells have an abnormal "sickle" shape. ? Liver disease.  Are obese. What are the signs or symptoms? Symptoms of this condition can range from mild to severe. Symptoms may appear any time from 2 to 14 days after being exposed to the virus. They include:  A fever or chills.  A cough.  Difficulty breathing.  Headaches, body aches, or muscle aches.  Runny or stuffy (congested) nose.  A sore throat.  New loss of taste or smell. Some people may also have stomach problems, such as nausea, vomiting, or diarrhea. Other people may not have any symptoms of COVID-19. How is this diagnosed? This condition may be diagnosed based on:  Your signs and symptoms, especially if: ? You live in an area with a COVID-19 outbreak. ? You recently traveled to or from an area where the virus is common. ? You   provide care for or live with a person who was diagnosed with COVID-19. ? You were exposed to a person who was diagnosed with COVID-19.  A physical exam.  Lab tests, which may include: ? Taking a sample of fluid from the back of your nose and throat (nasopharyngeal fluid), your nose, or your throat using a swab. ? A sample of mucus from your lungs (sputum). ? Blood tests.  Imaging tests,  which may include, X-rays, CT scan, or ultrasound. How is this treated? At present, there is no medicine to treat COVID-19. Medicines that treat other diseases are being used on a trial basis to see if they are effective against COVID-19. Your health care provider will talk with you about ways to treat your symptoms. For most people, the infection is mild and can be managed at home with rest, fluids, and over-the-counter medicines. Treatment for a serious infection usually takes places in a hospital intensive care unit (ICU). It may include one or more of the following treatments. These treatments are given until your symptoms improve.  Receiving fluids and medicines through an IV.  Supplemental oxygen. Extra oxygen is given through a tube in the nose, a face mask, or a hood.  Positioning you to lie on your stomach (prone position). This makes it easier for oxygen to get into the lungs.  Continuous positive airway pressure (CPAP) or bi-level positive airway pressure (BPAP) machine. This treatment uses mild air pressure to keep the airways open. A tube that is connected to a motor delivers oxygen to the body.  Ventilator. This treatment moves air into and out of the lungs by using a tube that is placed in your windpipe.  Tracheostomy. This is a procedure to create a hole in the neck so that a breathing tube can be inserted.  Extracorporeal membrane oxygenation (ECMO). This procedure gives the lungs a chance to recover by taking over the functions of the heart and lungs. It supplies oxygen to the body and removes carbon dioxide. Follow these instructions at home: Lifestyle  If you are sick, stay home except to get medical care. Your health care provider will tell you how long to stay home. Call your health care provider before you go for medical care.  Rest at home as told by your health care provider.  Do not use any products that contain nicotine or tobacco, such as cigarettes,  e-cigarettes, and chewing tobacco. If you need help quitting, ask your health care provider.  Return to your normal activities as told by your health care provider. Ask your health care provider what activities are safe for you. General instructions  Take over-the-counter and prescription medicines only as told by your health care provider.  Drink enough fluid to keep your urine pale yellow.  Keep all follow-up visits as told by your health care provider. This is important. How is this prevented?  There is no vaccine to help prevent COVID-19 infection. However, there are steps you can take to protect yourself and others from this virus. To protect yourself:   Do not travel to areas where COVID-19 is a risk. The areas where COVID-19 is reported change often. To identify high-risk areas and travel restrictions, check the CDC travel website: wwwnc.cdc.gov/travel/notices  If you live in, or must travel to, an area where COVID-19 is a risk, take precautions to avoid infection. ? Stay away from people who are sick. ? Wash your hands often with soap and water for 20 seconds. If soap and water   are not available, use an alcohol-based hand sanitizer. ? Avoid touching your mouth, face, eyes, or nose. ? Avoid going out in public, follow guidance from your state and local health authorities. ? If you must go out in public, wear a cloth face covering or face mask. Make sure your mask covers your nose and mouth. ? Avoid crowded indoor spaces. Stay at least 6 feet (2 meters) away from others. ? Disinfect objects and surfaces that are frequently touched every day. This may include:  Counters and tables.  Doorknobs and light switches.  Sinks and faucets.  Electronics, such as phones, remote controls, keyboards, computers, and tablets. To protect others: If you have symptoms of COVID-19, take steps to prevent the virus from spreading to others.  If you think you have a COVID-19 infection, contact  your health care provider right away. Tell your health care team that you think you may have a COVID-19 infection.  Stay home. Leave your house only to seek medical care. Do not use public transport.  Do not travel while you are sick.  Wash your hands often with soap and water for 20 seconds. If soap and water are not available, use alcohol-based hand sanitizer.  Stay away from other members of your household. Let healthy household members care for children and pets, if possible. If you have to care for children or pets, wash your hands often and wear a mask. If possible, stay in your own room, separate from others. Use a different bathroom.  Make sure that all people in your household wash their hands well and often.  Cough or sneeze into a tissue or your sleeve or elbow. Do not cough or sneeze into your hand or into the air.  Wear a cloth face covering or face mask. Make sure your mask covers your nose and mouth. Where to find more information  Centers for Disease Control and Prevention: www.cdc.gov/coronavirus/2019-ncov/index.html  World Health Organization: www.who.int/health-topics/coronavirus Contact a health care provider if:  You live in or have traveled to an area where COVID-19 is a risk and you have symptoms of the infection.  You have had contact with someone who has COVID-19 and you have symptoms of the infection. Get help right away if:  You have trouble breathing.  You have pain or pressure in your chest.  You have confusion.  You have bluish lips and fingernails.  You have difficulty waking from sleep.  You have symptoms that get worse. These symptoms may represent a serious problem that is an emergency. Do not wait to see if the symptoms will go away. Get medical help right away. Call your local emergency services (911 in the [place] Do not drive yourself to the hospital. Let the emergency medical personnel know if you think you have  COVID-19. Summary  COVID-19 is a respiratory infection that is caused by a virus. It is also known as coronavirus disease or novel coronavirus. It can cause serious infections, such as pneumonia, acute respiratory distress syndrome, acute respiratory failure, or sepsis.  The virus that causes COVID-19 is contagious. This means that it can spread from person to person through droplets from breathing, speaking, singing, coughing, or sneezing.  You are more likely to develop a serious illness if you are 50 years of age or older, have a weak immune system, live in a nursing home, or have chronic disease.  There is no medicine to treat COVID-19. Your health care provider will talk with you about ways to treat your symptoms.    Take steps to protect yourself and others from infection. Wash your hands often and disinfect objects and surfaces that are frequently touched every day. Stay away from people who are sick and wear a mask if you are sick. This information is not intended to replace advice given to you by your health care provider. Make sure you discuss any questions you have with your health care provider. Document Revised: 09/10/2019 Document Reviewed: 12/17/2018 Elsevier Patient Education  [employer].   Atrial Fibrillation  Atrial fibrillation is a type of heartbeat that is irregular or fast. If you have this condition, your heart beats without any order. This makes it hard for your heart to pump blood in a normal way. Atrial fibrillation may come and go, or it may become a long-lasting problem. If this condition is not treated, it can put you at higher risk for stroke, heart failure, and other heart problems. What are the causes? This condition may be caused by diseases that damage the heart. They include:  High blood pressure.  Heart failure.  Heart valve disease.  Heart surgery. Other causes include:  Diabetes.  Thyroid disease.  Being overweight.  Kidney  disease. Sometimes the cause is not known. What increases the risk? You are more likely to develop this condition if:  You are older.  You smoke.  You exercise often and very hard.  You have a family history of this condition.  You are a man.  You use drugs.  You drink a lot of alcohol.  You have lung conditions, such as emphysema, pneumonia, or COPD.  You have sleep apnea. What are the signs or symptoms? Common symptoms of this condition include:  A feeling that your heart is beating very fast.  Chest pain or discomfort.  Feeling short of breath.  Suddenly feeling light-headed or weak.  Getting tired easily during activity.  Fainting.  Sweating. In some cases, there are no symptoms. How is this treated? Treatment for this condition depends on underlying conditions and how you feel when you have atrial fibrillation. They include:  Medicines to: ? Prevent blood clots. ? Treat heart rate or heart rhythm problems.  Using devices, such as a pacemaker, to correct heart rhythm problems.  Doing surgery to remove the part of the heart that sends bad signals.  Closing an area where clots can form in the heart (left atrial appendage). In some cases, your doctor will treat other underlying conditions. Follow these instructions at home: Medicines  Take over-the-counter and prescription medicines only as told by your doctor.  Do not take any new medicines without first talking to your doctor.  If you are taking blood thinners: ? Talk with your doctor before you take any medicines that have aspirin or NSAIDs, such as ibuprofen, in them. ? Take your medicine exactly as told by your doctor. Take it at the same time each day. ? Avoid activities that could hurt or bruise you. Follow instructions about how to prevent falls. ? Wear a bracelet that says you are taking blood thinners. Or, carry a card that lists what medicines you take. Lifestyle      Do not use any  products that have nicotine or tobacco in them. These include cigarettes, e-cigarettes, and chewing tobacco. If you need help quitting, ask your doctor.  Eat heart-healthy foods. Talk with your doctor about the right eating plan for you.  Exercise regularly as told by your doctor.  Do not drink alcohol.  Lose weight if you  are overweight.  Do not use drugs, including cannabis. General instructions  If you have a condition that causes breathing to stop for a short period of time (apnea), treat it as told by your doctor.  Keep a healthy weight. Do not use diet pills unless your doctor says they are safe for you. Diet pills may make heart problems worse.  Keep all follow-up visits as told by your doctor. This is important. Contact a doctor if:  You notice a change in the speed, rhythm, or strength of your heartbeat.  You are taking a blood-thinning medicine and you get more bruising.  You get tired more easily when you move or exercise.  You have a sudden change in weight. Get help right away if:   You have pain in your chest or your belly (abdomen).  You have trouble breathing.  You have side effects of blood thinners, such as blood in your vomit, poop (stool), or pee (urine), or bleeding that cannot stop.  You have any signs of a stroke. "BE FAST" is an easy way to remember the main warning signs: ? B - Balance. Signs are dizziness, sudden trouble walking, or loss of balance. ? E - Eyes. Signs are trouble seeing or a change in how you see. ? F - Face. Signs are sudden weakness or loss of feeling in the face, or the face or eyelid drooping on one side. ? A - Arms. Signs are weakness or loss of feeling in an arm. This happens suddenly and usually on one side of the body. ? S - Speech. Signs are sudden trouble speaking, slurred speech, or trouble understanding what people say. ? T - Time. Time to call emergency services. Write down what time symptoms started.  You have other  signs of a stroke, such as: ? A sudden, very bad headache with no known cause. ? Feeling like you may vomit (nausea). ? Vomiting. ? A seizure. These symptoms may be an emergency. Do not wait to see if the symptoms will go away. Get medical help right away. Call your local emergency services (911 in the [place] Do not drive yourself to the hospital. Summary  Atrial fibrillation is a type of heartbeat that is irregular or fast.  You are at higher risk of this condition if you smoke, are older, have diabetes, or are overweight.  Follow your doctor's instructions about medicines, diet, exercise, and follow-up visits.  Get help right away if you have signs or symptoms of a stroke.  Get help right away if you cannot catch your breath, or you have chest pain or discomfort. This information is not intended to replace advice given to you by your health care provider. Make sure you discuss any questions you have with your health care provider. Document Revised: 05/05/2019 Document Reviewed: 05/05/2019 Elsevier Patient Education  [place].   COVID-19 Frequently Asked Questions COVID-19 (coronavirus disease) is an infection that is caused by a large family of viruses. Some viruses cause illness in people and others cause illness in animals like camels, cats, and bats. In some cases, the viruses that cause illness in animals can spread to humans. Where did the coronavirus come from? In December 2019, [place] told the [facility]) of several cases of lung disease (human respiratory illness). These cases were linked to an open seafood and livestock market in the city of [place]. The link to the seafood and livestock market suggests that the virus may have spread from animals  to humans. However, since that first outbreak in December, the virus has also been shown to spread from person to person. What is the name of the disease and the virus? Disease name Early on, this disease was  called novel coronavirus. This is because scientists determined that the disease was caused by a new (novel) respiratory virus. The World Health Organization [facility]) has now named the disease COVID-19, or coronavirus disease. Virus name The virus that causes the disease is called severe acute respiratory syndrome coronavirus 2 (SARS-CoV-2). More information on disease and virus naming World Health Organization [facility]): www.who.int/emergencies/diseases/novel-coronavirus-2019/technical-guidance/naming-the-coronavirus-disease-(covid-2019)-and-the-virus-that-causes-it Who is at risk for complications from coronavirus disease? Some people may be at higher risk for complications from coronavirus disease. This includes older adults and people who have chronic diseases, such as heart disease, diabetes, and lung disease. If you are at higher risk for complications, take these extra precautions:  Stay home as much as possible.  Avoid social gatherings and travel.  Avoid close contact with others. Stay at least 6 ft (2 m) away from others, if possible.  Wash your hands often with soap and water for at least 20 seconds.  Avoid touching your face, mouth, nose, or eyes.  Keep supplies on hand at home, such as food, medicine, and cleaning supplies.  If you must go out in public, wear a cloth face covering or face mask. Make sure your mask covers your nose and mouth. How does coronavirus disease spread? The virus that causes coronavirus disease spreads easily from person to person (is contagious). You may catch the virus by:  Breathing in droplets from an infected person. Droplets can be spread by a person breathing, speaking, singing, coughing, or sneezing.  Touching something, like a table or a doorknob, that was exposed to the virus (contaminated) and then touching your mouth, nose, or eyes. Can I get the virus from touching surfaces or objects? There is still a lot that we do not know about the virus  that causes coronavirus disease. Scientists are basing a lot of information on what they know about similar viruses, such as:  Viruses cannot generally survive on surfaces for long. They need a human body (host) to survive.  It is more likely that the virus is spread by close contact with people who are sick (direct contact), such as through: ? Shaking hands or hugging. ? Breathing in respiratory droplets that travel through the air. Droplets can be spread by a person breathing, speaking, singing, coughing, or sneezing.  It is less likely that the virus is spread when a person touches a surface or object that has the virus on it (indirect contact). The virus may be able to enter the body if the person touches a surface or object and then touches his or her face, eyes, nose, or mouth. Can a person spread the virus without having symptoms of the disease? It may be possible for the virus to spread before a person has symptoms of the disease, but this is most likely not the main way the virus is spreading. It is more likely for the virus to spread by being in close contact with people who are sick and breathing in the respiratory droplets spread by a person breathing, speaking, singing, coughing, or sneezing. What are the symptoms of coronavirus disease? Symptoms vary from person to person and can range from mild to severe. Symptoms may include:  Fever or chills.  Cough.  Difficulty breathing or feeling short of breath.  Headaches, body aches, or  muscle aches.  Runny or stuffy (congested) nose.  Sore throat.  New loss of taste or smell.  Nausea, vomiting, or diarrhea. These symptoms can appear anywhere from 2 to 14 days after you have been exposed to the virus. Some people may not have any symptoms. If you develop symptoms, call your health care provider. People with severe symptoms may need hospital care. Should I be tested for this virus? Your health care provider will decide whether to  test you based on your symptoms, history of exposure, and your risk factors. How does a health care provider test for this virus? Health care providers will collect samples to send for testing. Samples may include:  Taking a swab of fluid from the back of your nose and throat, your nose, or your throat.  Taking fluid from the lungs by having you cough up mucus (sputum) into a sterile cup.  Taking a blood sample. Is there a treatment or vaccine for this virus? Currently, there is no vaccine to prevent coronavirus disease. Also, there are no medicines like antibiotics or antivirals to treat the virus. A person who becomes sick is given supportive care, which means rest and fluids. A person may also relieve his or her symptoms by using over-the-counter medicines that treat sneezing, coughing, and runny nose. These are the same medicines that a person takes for the common cold. If you develop symptoms, call your health care provider. People with severe symptoms may need hospital care. What can I do to protect myself and my family from this virus?     You can protect yourself and your family by taking the same actions that you would take to prevent the spread of other viruses. Take the following actions:  Wash your hands often with soap and water for at least 20 seconds. If soap and water are not available, use alcohol-based hand sanitizer.  Avoid touching your face, mouth, nose, or eyes.  Cough or sneeze into a tissue, sleeve, or elbow. Do not cough or sneeze into your hand or the air. ? If you cough or sneeze into a tissue, throw it away immediately and wash your hands.  Disinfect objects and surfaces that you frequently touch every day.  Stay away from people who are sick.  Avoid going out in public, follow guidance from your state and local health authorities.  Avoid crowded indoor spaces. Stay at least 6 ft (2 m) away from others.  If you must go out in public, wear a cloth face  covering or face mask. Make sure your mask covers your nose and mouth.  Stay home if you are sick, except to get medical care. Call your health care provider before you get medical care. Your health care provider will tell you how long to stay home.  Make sure your vaccines are up to date. Ask your health care provider what vaccines you need. What should I do if I need to travel? Follow travel recommendations from your local health authority, the CDC, and WHO. Travel information and advice  Centers for Disease Control and Prevention (CDC): [employer]  World Health Organization [facility]): [employer] Know the risks and take action to protect your health  You are at higher risk of getting coronavirus disease if you are traveling to areas with an outbreak or if you are exposed to travelers from areas with an outbreak.  Wash your hands often and practice good hygiene to lower the risk of catching or spreading the virus. What should I do  if I am sick? General instructions to stop the spread of infection  Wash your hands often with soap and water for at least 20 seconds. If soap and water are not available, use alcohol-based hand sanitizer.  Cough or sneeze into a tissue, sleeve, or elbow. Do not cough or sneeze into your hand or the air.  If you cough or sneeze into a tissue, throw it away immediately and wash your hands.  Stay home unless you must get medical care. Call your health care provider or local health authority before you get medical care.  Avoid public areas. Do not take public transportation, if possible.  If you can, wear a mask if you must go out of the house or if you are in close contact with someone who is not sick. Make sure your mask covers your nose and mouth. Keep your home clean  Disinfect objects and surfaces that are frequently touched every day. This may  include: ? Counters and tables. ? Doorknobs and light switches. ? Sinks and faucets. ? Electronics such as phones, remote controls, keyboards, computers, and tablets.  Wash dishes in hot, soapy water or use a dishwasher. Air-dry your dishes.  Wash laundry in hot water. Prevent infecting other household members  Let healthy household members care for children and pets, if possible. If you have to care for children or pets, wash your hands often and wear a mask.  Sleep in a different bedroom or bed, if possible.  Do not share personal items, such as razors, toothbrushes, deodorant, combs, brushes, towels, and washcloths. Where to find more information Centers for Disease Control and Prevention (CDC)  Information and news updates: https://www.butler-gonzalez.com/ World Health Organization [facility])  Information and news updates: MissExecutive.com.ee  Coronavirus health topic: https://www.castaneda.info/  Questions and answers on COVID-19: OpportunityDebt.at  Global tracker: who.sprinklr.com American Academy of Pediatrics (AAP)  Information for families: www.healthychildren.org/English/health-issues/conditions/chest-lungs/Pages/2019-Novel-Coronavirus.aspx The coronavirus situation is changing rapidly. Check your local health authority website or the CDC and [facility] websites for updates and news. When should I contact a health care provider?  Contact your health care provider if you have symptoms of an infection, such as fever or cough, and you: ? Have been near anyone who is known to have coronavirus disease. ? Have come into contact with a person who is suspected to have coronavirus disease. ? Have traveled to an area where there is an outbreak of COVID-19. When should I get emergency medical care?  Get help right away by calling your local emergency services (911 in the [place] if you have: ? Trouble  breathing. ? Pain or pressure in your chest. ? Confusion. ? Blue-tinged lips and fingernails. ? Difficulty waking from sleep. ? Symptoms that get worse. Let the emergency medical personnel know if you think you have coronavirus disease. Summary  A new respiratory virus is spreading from person to person and causing COVID-19 (coronavirus disease).  The virus that causes COVID-19 appears to spread easily. It spreads from one person to another through droplets from breathing, speaking, singing, coughing, or sneezing.  Older adults and those with chronic diseases are at higher risk of disease. If you are at higher risk for complications, take extra precautions.  There is currently no vaccine to prevent coronavirus disease. There are no medicines, such as antibiotics or antivirals, to treat the virus.  You can protect yourself and your family by washing your hands often, avoiding touching your face, and covering your coughs and sneezes. This information is not intended to replace  advice given to you by your health care provider. Make sure you discuss any questions you have with your health care provider. Document Revised: 09/10/2019 Document Reviewed: 03/09/2019 Elsevier Patient Education  [place] Can Do to Manage Your COVID-19 Symptoms at Home If you have possible or confirmed COVID-19: 1. Stay home from work and school. And stay away from other public places. If you must go out, avoid using any kind of public transportation, ridesharing, or taxis. 2. Monitor your symptoms carefully. If your symptoms get worse, call your healthcare provider immediately. 3. Get rest and stay hydrated. 4. If you have a medical appointment, call the healthcare provider ahead of time and tell them that you have or may have COVID-19. 5. For medical emergencies, call 911 and notify the dispatch personnel that you have or may have COVID-19. 6. Cover your cough and sneezes with a tissue or  use the inside of your elbow. 7. Wash your hands often with soap and water for at least 20 seconds or clean your hands with an alcohol-based hand sanitizer that contains at least 60% alcohol. 8. As much as possible, stay in a specific room and away from other people in your home. Also, you should use a separate bathroom, if available. If you need to be around other people in or outside of the home, wear a mask. 9. Avoid sharing personal items with other people in your household, like dishes, towels, and bedding. 10. Clean all surfaces that are touched often, like counters, tabletops, and doorknobs. Use household cleaning sprays or wipes according to the label instructions. michellinders.com 05/26/2019 This information is not intended to replace advice given to you by your health care provider. Make sure you discuss any questions you have with your health care provider. Document Revised: 10/28/2019 Document Reviewed: 10/28/2019 Elsevier Patient Education  [place].

## 2020-01-15 NOTE — Discharge Summary (Signed)
[place] [facility] [place] NAME: Gloria Blair    MR#:  [id number]  DATE OF BIRTH:  28-Jul-1946  DATE OF ADMISSION:  01/11/2020   ADMITTING PHYSICIAN: Christel Mormon, MD  DATE OF DISCHARGE: 01/13/2020 12:49 PM  PRIMARY CARE PHYSICIAN: Denton Lank, MD   ADMISSION DIAGNOSIS:  Atrial fibrillation with rapid ventricular response ([place]) [I48.91] Pneumonia due to COVID-19 virus [U07.1, J12.82] COVID-19 [U07.1] DISCHARGE DIAGNOSIS:  Active Problems:   COVID-19   Atrial fibrillation with rapid ventricular response ([place])  SECONDARY DIAGNOSIS:   Past Medical History:  Diagnosis Date  . Asthma   . Diabetes mellitus without complication ([place])   . GERD (gastroesophageal reflux disease)   . Hypertension   . Obesity    HOSPITAL COURSE:  MaryRichmondis a74 y.o.African-American femalewith a known history of asthma, type diabetes mellitus, hypertension and GERD as well as recent COVID-19 diagnosed about 5 days ago, admitted for acute onset of generalized weakness and mild dry cough  1. Pneumonia due to COVID-19. -Continue vitamin C and zinc. Did not require any oxygen and thus no remdesivir  2. Atrial fibrillation with rapid ventricular response. -This has spontaneously converted with IV hydration. -The patient CHA2DS2-VASc score is 4 and cardiology recommended starting Eliquis for anticoagulation, metoprolol for rate control -Normal 2D echo. Cardiology recommends outpatient follow-up at their office -Her TSH was unremarkable.  3. Hypertension. - Control with antihypertensives  4. Dyslipidemia. -continue her statin therapy.  5. Type 2 diabetes mellitus. - continue her Glucotrol  DISCHARGE CONDITIONS:  Stable CONSULTS OBTAINED:  Treatment Team:  Dionisio David, MD DRUG ALLERGIES:  No Known Allergies DISCHARGE MEDICATIONS:   Allergies as of 01/13/2020   No Known Allergies     Medication List    TAKE these medications   albuterol  108 (90 Base) MCG/ACT inhaler Commonly known as: VENTOLIN HFA Inhale 2 puffs into the lungs every 6 (six) hours as needed for wheezing.   albuterol-ipratropium 18-103 MCG/ACT inhaler Commonly known as: COMBIVENT Inhale 2 puffs into the lungs every 6 (six) hours as needed for wheezing or shortness of breath.   apixaban 5 MG Tabs tablet Commonly known as: ELIQUIS Take 1 tablet (5 mg total) by mouth 2 (two) times daily.   aspirin 81 MG tablet Take 81 mg by mouth daily.   atorvastatin 80 MG tablet Commonly known as: LIPITOR Take 80 mg by mouth daily.   cloNIDine 0.1 MG tablet Commonly known as: CATAPRES Take 0.1 mg by mouth 2 (two) times daily.   geriatric multivitamins-minerals Elix Take 15 mLs by mouth daily.   glipiZIDE 10 MG tablet Commonly known as: GLUCOTROL Take 10 mg by mouth daily before breakfast.   insulin detemir 100 UNIT/ML injection Commonly known as: LEVEMIR Inject 20 Units into the skin daily at 6 (six) AM.   lisinopril-hydrochlorothiazide 20-25 MG tablet Commonly known as: ZESTORETIC Take 1 tablet by mouth daily.   meloxicam 7.5 MG tablet Commonly known as: MOBIC Take 7.5 mg by mouth daily.   metFORMIN 1000 MG tablet Commonly known as: GLUCOPHAGE Take 1,000 mg by mouth 2 (two) times daily with a meal.   metoprolol succinate 25 MG 24 hr tablet Commonly known as: Toprol XL Take 1 tablet (25 mg total) by mouth daily.   multivitamin capsule Take 1 capsule by mouth daily.   predniSONE 10 MG (21) Tbpk tablet Commonly known as: STERAPRED UNI-PAK 21 TAB Start 60 mg p.o. daily, taper 10 mg  daily until finished   vitamin C 250 MG tablet Commonly known as: ASCORBIC ACID Take 1 tablet (250 mg total) by mouth daily.   Zinc Sulfate 220 (50 Zn) MG Tabs Take 1 tablet (220 mg total) by mouth every morning.      DISCHARGE INSTRUCTIONS:   DIET:  Cardiac diet DISCHARGE CONDITION:  Stable ACTIVITY:  Activity as tolerated OXYGEN:  Home Oxygen: No.   Oxygen Delivery: room air DISCHARGE LOCATION:  home   If you experience worsening of your admission symptoms, develop shortness of breath, life threatening emergency, suicidal or homicidal thoughts you must seek medical attention immediately by calling 911 or calling your MD immediately  if symptoms less severe.  You Must read complete instructions/literature along with all the possible adverse reactions/side effects for all the Medicines you take and that have been prescribed to you. Take any new Medicines after you have completely understood and accpet all the possible adverse reactions/side effects.   Please note  You were cared for by a hospitalist during your hospital stay. If you have any questions about your discharge medications or the care you received while you were in the hospital after you are discharged, you can call the unit and asked to speak with the hospitalist on call if the hospitalist that took care of you is not available. Once you are discharged, your primary care physician will handle any further medical issues. Please note that NO REFILLS for any discharge medications will be authorized once you are discharged, as it is imperative that you return to your primary care physician (or establish a relationship with a primary care physician if you do not have one) for your aftercare needs so that they can reassess your need for medications and monitor your lab values.    On the day of Discharge:  VITAL SIGNS:  Blood pressure (!) 146/55, pulse 61, temperature 98.5 F (36.9 C), temperature source Axillary, resp. rate 19, height 5\' 6"  (1.676 m), weight 104.3 kg, SpO2 97 %. PHYSICAL EXAMINATION:  GENERAL:  74 y.o.-year-old patient lying in the bed with no acute distress.  EYES: Pupils equal, round, reactive to light and accommodation. No scleral icterus. Extraocular muscles intact.  HEENT: Head atraumatic, normocephalic. Oropharynx and nasopharynx clear.  NECK:  Supple, no  jugular venous distention. No thyroid enlargement, no tenderness.  LUNGS: Normal breath sounds bilaterally, no wheezing, rales,rhonchi or crepitation. No use of accessory muscles of respiration.  CARDIOVASCULAR: S1, S2 normal. No murmurs, rubs, or gallops.  ABDOMEN: Soft, non-tender, non-distended. Bowel sounds present. No organomegaly or mass.  EXTREMITIES: No pedal edema, cyanosis, or clubbing.  NEUROLOGIC: Cranial nerves II through XII are intact. Muscle strength 5/5 in all extremities. Sensation intact. Gait not checked.  PSYCHIATRIC: The patient is alert and oriented x 3.  SKIN: No obvious rash, lesion, or ulcer.  DATA REVIEW:   CBC Recent Labs  Lab 01/13/20 0434  WBC 3.6*  HGB 11.3*  HCT 36.1  PLT 199    Chemistries  Recent Labs  Lab 01/11/20 2245 01/12/20 0239 01/13/20 0434  NA  --    < > 137  K  --    < > 4.1  CL  --    < > 106  CO2  --    < > 25  GLUCOSE  --    < > 207*  BUN  --    < > 18  CREATININE  --    < > 0.66  CALCIUM  --    < >  8.0*  MG 1.5*  --   --   AST  --    < > 39  ALT  --    < > 23  ALKPHOS  --    < > 52  BILITOT  --    < > 0.7   < > = values in this interval not displayed.     Outpatient follow-up Follow-up Information    Denton Lank, MD. Go on 01/20/2020.   Specialty: Family Medicine Why: @3 :00 PM Contact information: [address] [place] [address] [phone]        Dionisio David, MD. Daphane Shepherd on 01/20/2020.   Specialty: Cardiology Why: @10 :30 AM Contact information: [place] [address] [phone]            Management plans discussed with the patient, family and they are in agreement.  CODE STATUS: Prior   TOTAL TIME TAKING CARE OF THIS PATIENT: 45 minutes.    Max Sane M.D on 01/15/2020 at 3:17 PM  [facility]   CC: Primary care physician; Denton Lank, MD   Note: This dictation was prepared with Dragon dictation along with smaller phrase technology. Any  transcriptional errors that result from this process are unintentional.

## 2020-01-16 LAB — CULTURE, BLOOD (ROUTINE X 2)
Culture: NO GROWTH
Culture: NO GROWTH
Special Requests: ADEQUATE

## 2020-01-24 ENCOUNTER — Other Ambulatory Visit: Payer: Self-pay | Admitting: Internal Medicine

## 2020-01-24 DIAGNOSIS — U071 COVID-19: Secondary | ICD-10-CM

## 2020-02-09 ENCOUNTER — Other Ambulatory Visit: Payer: Self-pay | Admitting: Internal Medicine

## 2020-12-18 ENCOUNTER — Other Ambulatory Visit: Payer: Self-pay | Admitting: Internal Medicine

## 2020-12-18 DIAGNOSIS — Z1231 Encounter for screening mammogram for malignant neoplasm of breast: Secondary | ICD-10-CM

## 2020-12-27 ENCOUNTER — Other Ambulatory Visit: Payer: Self-pay | Admitting: Internal Medicine

## 2021-01-15 ENCOUNTER — Other Ambulatory Visit: Payer: Self-pay

## 2021-01-15 ENCOUNTER — Ambulatory Visit
Admission: RE | Admit: 2021-01-15 | Discharge: 2021-01-15 | Disposition: A | Discharge status: Home / Self Care | Payer: Medicare Other | Source: Ambulatory Visit | Attending: Internal Medicine | Admitting: Internal Medicine

## 2021-01-15 DIAGNOSIS — Z1231 Encounter for screening mammogram for malignant neoplasm of breast: Secondary | ICD-10-CM | POA: Diagnosis not present

## 2021-07-16 DIAGNOSIS — I480 Paroxysmal atrial fibrillation: Secondary | ICD-10-CM | POA: Diagnosis not present

## 2021-07-16 DIAGNOSIS — I10 Essential (primary) hypertension: Secondary | ICD-10-CM | POA: Diagnosis not present

## 2021-07-16 DIAGNOSIS — E1140 Type 2 diabetes mellitus with diabetic neuropathy, unspecified: Secondary | ICD-10-CM | POA: Diagnosis not present

## 2021-07-16 DIAGNOSIS — Z0000 Encounter for general adult medical examination without abnormal findings: Secondary | ICD-10-CM | POA: Diagnosis not present

## 2021-07-16 DIAGNOSIS — E785 Hyperlipidemia, unspecified: Secondary | ICD-10-CM | POA: Diagnosis not present

## 2021-08-14 ENCOUNTER — Other Ambulatory Visit: Payer: Self-pay | Admitting: Family Medicine

## 2021-08-14 DIAGNOSIS — F0390 Unspecified dementia without behavioral disturbance: Secondary | ICD-10-CM

## 2021-08-24 ENCOUNTER — Ambulatory Visit: Payer: Medicare Other

## 2021-08-24 ENCOUNTER — Other Ambulatory Visit: Payer: Self-pay

## 2021-08-24 ENCOUNTER — Ambulatory Visit
Admission: RE | Admit: 2021-08-24 | Discharge: 2021-08-24 | Disposition: A | Discharge status: Home / Self Care | Payer: Medicare Other | Source: Ambulatory Visit | Attending: Family Medicine | Admitting: Family Medicine

## 2021-08-24 DIAGNOSIS — F0390 Unspecified dementia without behavioral disturbance: Secondary | ICD-10-CM | POA: Diagnosis not present

## 2021-08-24 DIAGNOSIS — J3489 Other specified disorders of nose and nasal sinuses: Secondary | ICD-10-CM | POA: Diagnosis not present

## 2021-08-24 DIAGNOSIS — G319 Degenerative disease of nervous system, unspecified: Secondary | ICD-10-CM | POA: Diagnosis not present

## 2022-03-24 IMAGING — MR MR HEAD W/O CM
12 series · 47 of 48 positions shown · non-contrast
Comparison: MRI brain 05/20/2006.  Head CT 05/19/2006.

CLINICAL DATA: Dementia without behavioral disturbance, unspecified
dementia type.

EXAM:
MRI HEAD WITHOUT CONTRAST
TECHNIQUE: Multiplanar, multiecho pulse sequences of the brain and surrounding
structures were obtained without intravenous contrast.

[Series 5: ax dwi_tracew · axial · 3.0mm · 0.65mm/px · z∈[-113,+41]mm · 3 of 48 slices shown]
[im 1/48]
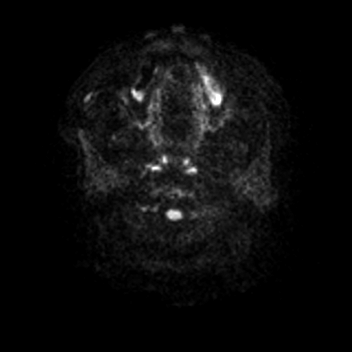
[im 24/48]
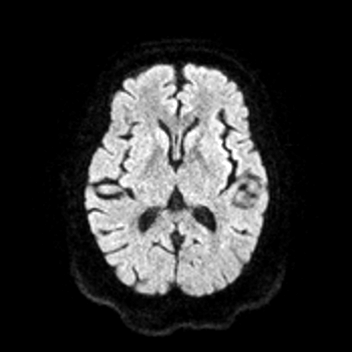
[im 48/48]
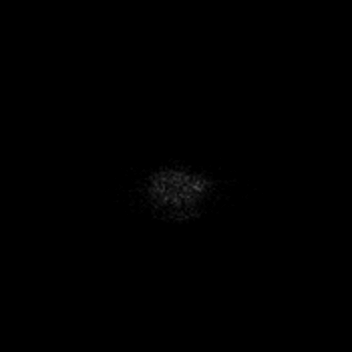

[Series 6: ax dwi_adc · axial · 3.0mm · 0.65mm/px · z∈[-113,+41]mm · 4 of 48 slices shown]
[im 1/48]
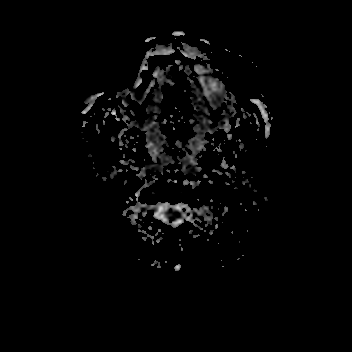
[im 16/48]
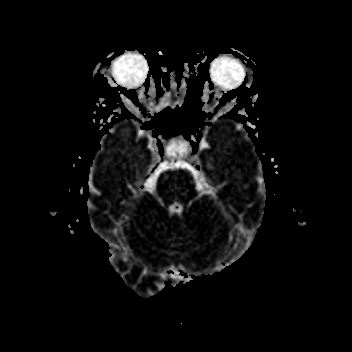
[im 32/48]
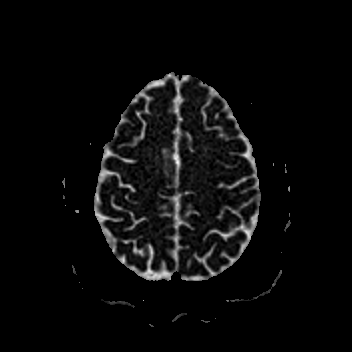
[im 48/48]
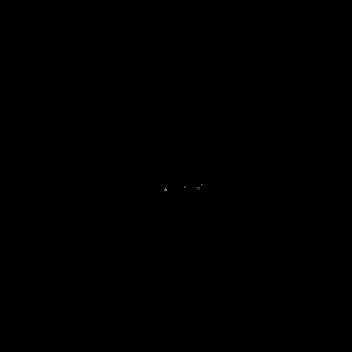

[Series 7: cor dwi_tracew · coronal · 5.0mm · 0.65mm/px · 3 of 36 slices shown]
[im 1/36]
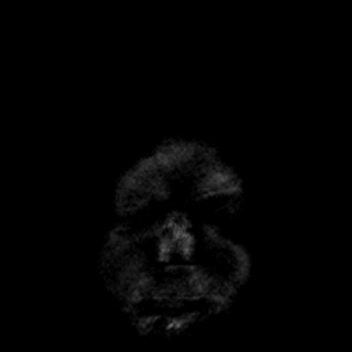
[im 18/36]
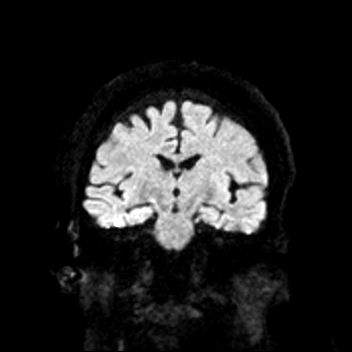
[im 36/36]
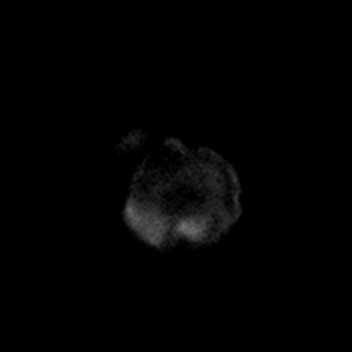

[Series 8: cor dwi_adc · coronal · 5.0mm · 0.65mm/px · 3 of 35 slices shown]
[im 1/35]
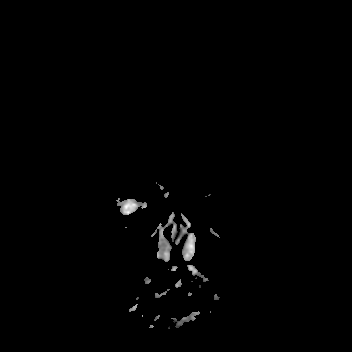
[im 18/35]
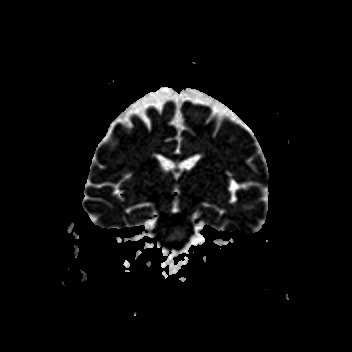
[im 35/35]
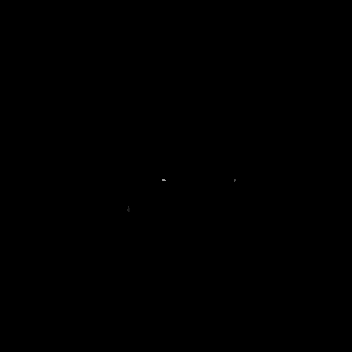

[Series 9: T1 · sagittal · 5.0mm · 0.62mm/px · 2 of 21 slices shown (1 of 2)]
[im 1/21]
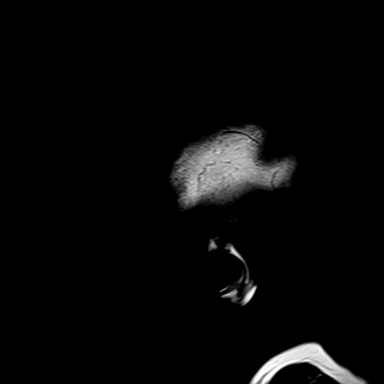
[im 21/21]
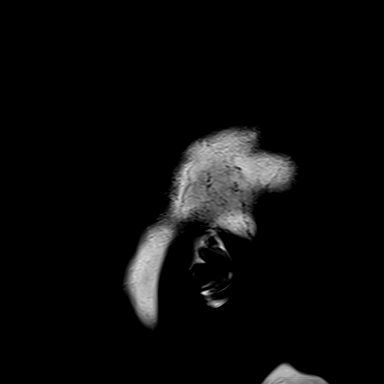

[Series 10: T2 · axial · 5.0mm · 0.53mm/px · z∈[-108,+35]mm · 2 of 25 slices shown (1 of 2)]
[im 1/25]
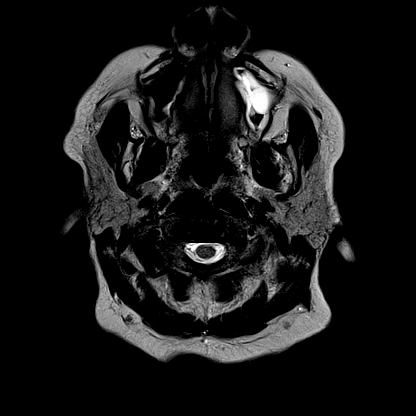
[im 25/25]
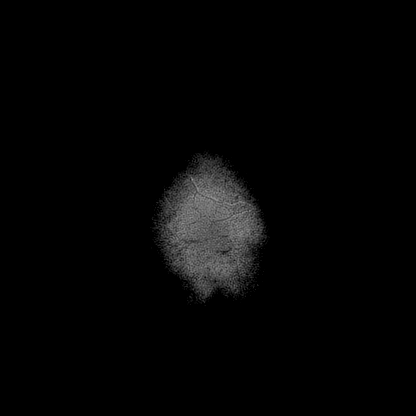

[Series 11: mag_images · axial · 3.0mm · 0.90mm/px · z∈[-125,+52]mm · 4 of 60 slices shown]
[im 1/60]
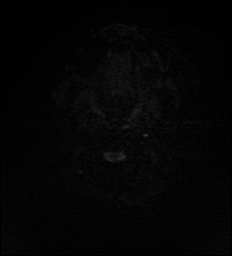
[im 20/60]
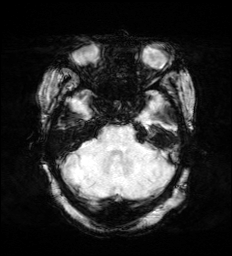
[im 40/60]
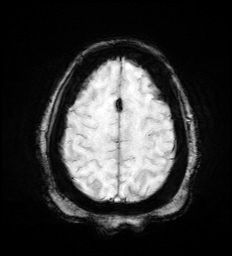
[im 60/60]
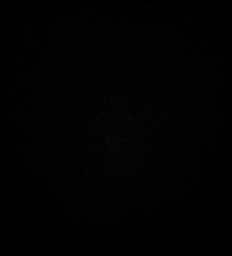

[Series 12: pha_images · axial · 3.0mm · 0.90mm/px · z∈[-125,+52]mm · 4 of 58 slices shown]
[im 1/58]
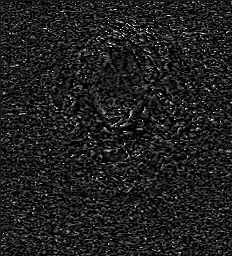
[im 20/58]
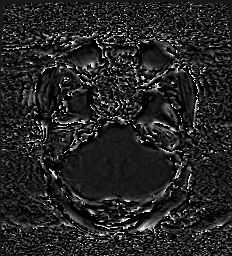
[im 39/58]
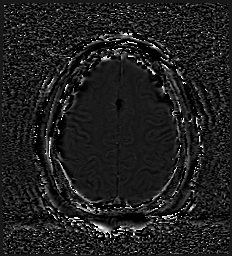
[im 58/58]
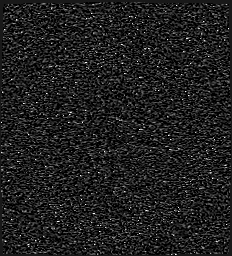

[Series 13: swi_images · axial · 3.0mm · 0.90mm/px · z∈[-125,+52]mm · 4 of 60 slices shown]
[im 1/60]
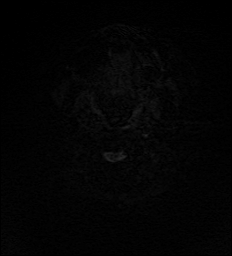
[im 20/60]
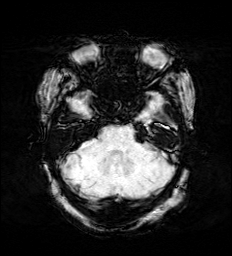
[im 40/60]
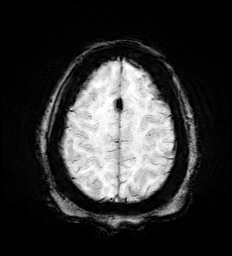
[im 60/60]
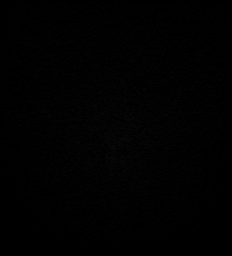

[Series 15: FLAIR · axial · 3.0mm · 0.53mm/px · z∈[-117,+44]mm · 4 of 55 slices shown]
[im 1/55]
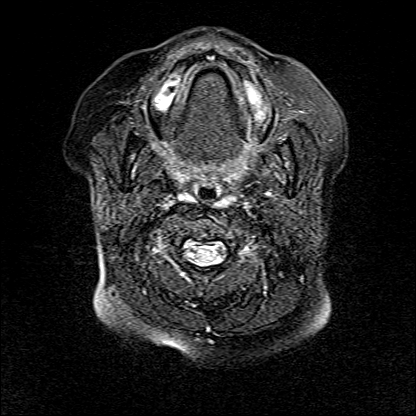
[im 19/55]
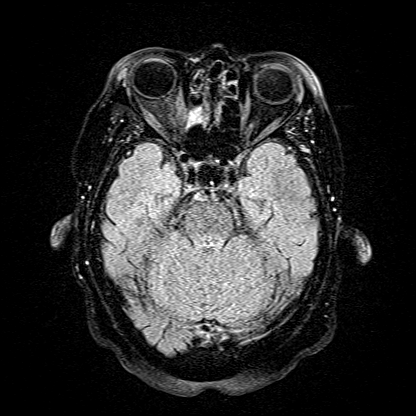
[im 37/55]
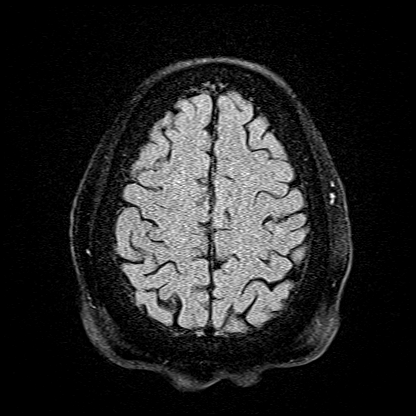
[im 55/55]
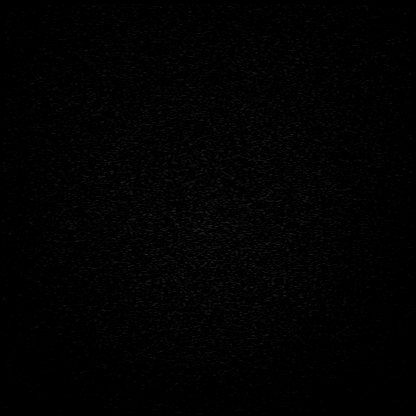

[Series 16: T1 · axial · 1.0mm · 0.98mm/px · z∈[-123,+52]mm · 12 of 176 slices shown (2 of 2)]
[im 1/176]
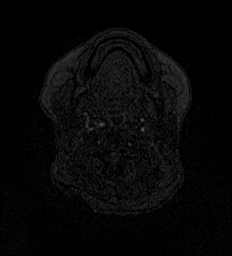
[im 15/176]
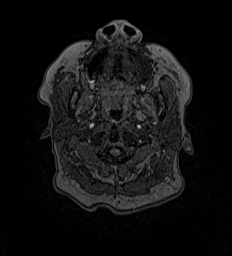
[im 30/176]
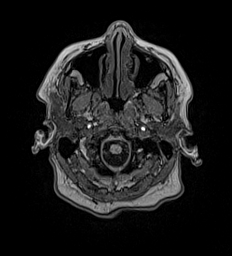
[im 44/176]
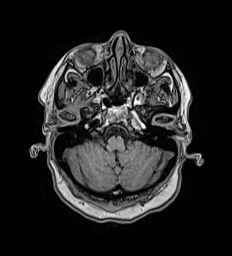
[im 59/176]
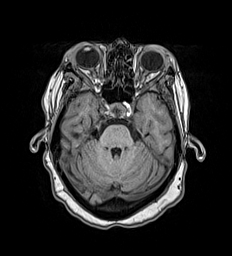
[im 73/176]
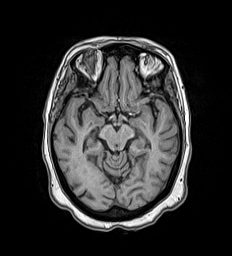
[im 88/176]
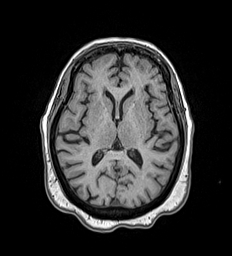
[im 103/176]
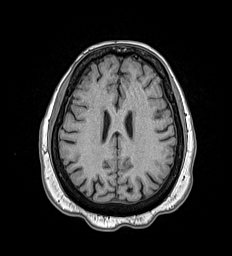
[im 117/176]
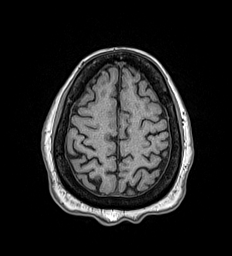
[im 132/176]
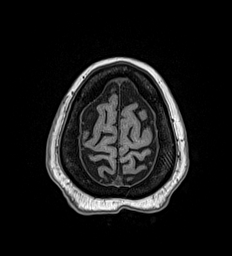
[im 146/176]
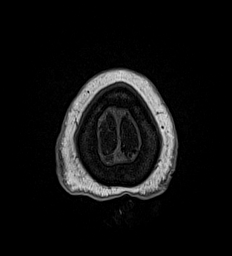
[im 176/176]
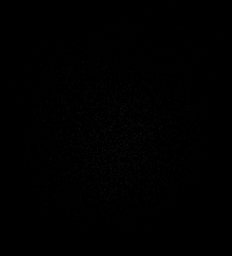

[Series 17: T2 · coronal · 5.0mm · 0.57mm/px · 2 of 29 slices shown (2 of 2)]
[im 1/29]
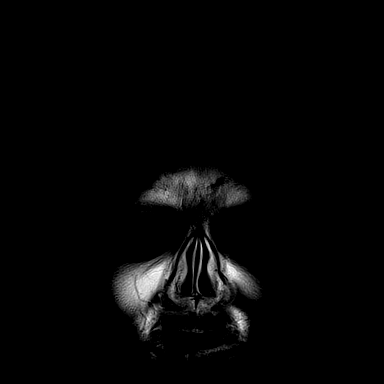
[im 29/29]
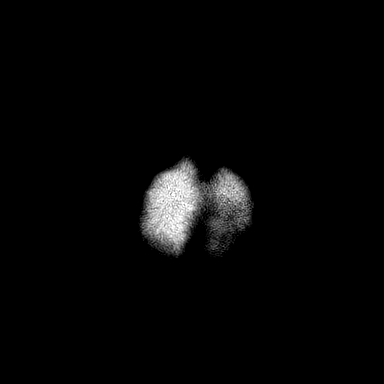

[47 of 48 positions shown; findings below may reference images not displayed]

FINDINGS: Brain:

Mild generalized cerebral and cerebellar atrophy.

Minimal multifocal T2 FLAIR hyperintense signal abnormality within
the cerebral white matter, nonspecific but compatible with chronic
small vessel ischemic disease.

There is no acute infarct.

No evidence of an intracranial mass.

No chronic intracranial blood products.

No extra-axial fluid collection.

No midline shift.

Partially empty sella turcica.

Vascular: Maintained flow voids within the proximal large arterial
vessels.

Skull and upper cervical spine: No focal suspicious marrow lesion.

Sinuses/Orbits: Visualized orbits show no acute finding. Mild
mucosal thickening within the bilateral maxillary sinuses.
Superimposed broad-based 1.9 cm mucous retention cyst within the
inferior left maxillary sinus. Mild mucosal thickening within the
right greater than left ethmoid air cells.

Other: Trace fluid within the bilateral mastoid air cells.
IMPRESSION: No evidence of acute intracranial abnormality.

Minimal chronic small-vessel ischemic changes within the cerebral
white matter, new from the brain MRI of 05/20/2006.

Mild generalized parenchymal atrophy, also progressed.

Paranasal sinus disease, as described.

Trace fluid within the bilateral mastoid air cells.

## 2022-08-02 ENCOUNTER — Encounter: Payer: Self-pay | Admitting: Otolaryngology

## 2023-03-14 ENCOUNTER — Encounter: Payer: Self-pay | Admitting: Internal Medicine

## 2023-03-14 ENCOUNTER — Ambulatory Visit (INDEPENDENT_AMBULATORY_CARE_PROVIDER_SITE_OTHER): Payer: 59 | Admitting: Internal Medicine

## 2023-03-14 VITALS — BP 130/74 | HR 68 | Ht 64.0 in | Wt 203.2 lb

## 2023-03-14 DIAGNOSIS — E782 Mixed hyperlipidemia: Secondary | ICD-10-CM

## 2023-03-14 DIAGNOSIS — I480 Paroxysmal atrial fibrillation: Secondary | ICD-10-CM | POA: Insufficient documentation

## 2023-03-14 DIAGNOSIS — I10 Essential (primary) hypertension: Secondary | ICD-10-CM | POA: Insufficient documentation

## 2023-03-14 DIAGNOSIS — M1990 Unspecified osteoarthritis, unspecified site: Secondary | ICD-10-CM | POA: Insufficient documentation

## 2023-03-14 DIAGNOSIS — E1165 Type 2 diabetes mellitus with hyperglycemia: Secondary | ICD-10-CM | POA: Insufficient documentation

## 2023-03-14 DIAGNOSIS — R413 Other amnesia: Secondary | ICD-10-CM | POA: Diagnosis not present

## 2023-03-14 DIAGNOSIS — E538 Deficiency of other specified B group vitamins: Secondary | ICD-10-CM

## 2023-03-14 DIAGNOSIS — Z86010 Personal history of colonic polyps: Secondary | ICD-10-CM

## 2023-03-14 DIAGNOSIS — E559 Vitamin D deficiency, unspecified: Secondary | ICD-10-CM | POA: Diagnosis not present

## 2023-03-14 DIAGNOSIS — Z1231 Encounter for screening mammogram for malignant neoplasm of breast: Secondary | ICD-10-CM

## 2023-03-14 DIAGNOSIS — Z13820 Encounter for screening for osteoporosis: Secondary | ICD-10-CM

## 2023-03-14 DIAGNOSIS — R5383 Other fatigue: Secondary | ICD-10-CM | POA: Insufficient documentation

## 2023-03-14 LAB — POC CREATINE & ALBUMIN,URINE
Creatinine, POC: 200 mg/dL
Microalbumin Ur, POC: 80 mg/L

## 2023-03-14 MED ORDER — DONEPEZIL HCL 5 MG PO TABS: 5.0000 mg | ORAL_TABLET | Freq: Every day | ORAL | 2 refills | Status: DC

## 2023-03-14 NOTE — Progress Notes (Signed)
Established Patient Office Visit  Subjective:  Patient ID: Gloria Blair, female    DOB: 02-20-1946  Age: 77 y.o. MRN: [id number]  Chief Complaint  Patient presents with   Follow-up    Follow up    Patient not in this office since February 2022.  Today she comes in accompanied by her daughter. She  reports that patient had started going to [facility].  She was getting regular medications from there and has been doing well.  However the family has noticed that patient is becoming little more confused and forgetful. Stays awake at night , but does not get agitated. Patient appears pleasant and offers no new complaints.  She states that she is feeling well and she is not bothered by any aches or pains.  However she is not oriented to person place or time.  She lives with her sister who manages her daily routine.  And make sure that she is taking her medications regularly. Patient is overdue for her preventatives.  Will schedule her mammogram and bone density. She has a personal history of colon polyps and will need another colonoscopy. Will check her blood work today.  Start Aricept 5 mg/day.    No other concerns at this time.   Past Medical History:  Diagnosis Date   Asthma    Diabetes mellitus without complication    GERD (gastroesophageal reflux disease)    Hypertension    Obesity     Past Surgical History:  Procedure Laterality Date   COLONOSCOPY WITH PROPOFOL N/A 08/07/2015   Procedure: COLONOSCOPY WITH PROPOFOL;  Surgeon: Elnita Maxwell, MD;  Location: [facility] ENDOSCOPY;  Service: Endoscopy;  Laterality: N/A;    Social History   Socioeconomic History   Marital status: Widowed    Spouse name: Not on file   Number of children: Not on file   Years of education: Not on file   Highest education level: Not on file  Occupational History   Not on file  Tobacco Use   Smoking status: Never   Smokeless tobacco: Never  Substance and Sexual Activity   Alcohol use:  Not on file   Drug use: Not on file   Sexual activity: Not on file  Other Topics Concern   Not on file  Social History Narrative   Not on file   Social Determinants of Health   Financial Resource Strain: Not on file  Food Insecurity: Not on file  Transportation Needs: Not on file  Physical Activity: Not on file  Stress: Not on file  Social Connections: Not on file  Intimate Partner Violence: Not on file    Family History  Problem Relation Age of Onset   Breast cancer Neg Hx     No Known Allergies  Review of Systems  Constitutional: Negative.   HENT: Negative.    Eyes: Negative.   Respiratory:  Negative for cough, shortness of breath and wheezing.   Cardiovascular:  Negative for chest pain, palpitations and orthopnea.  Gastrointestinal:  Negative for abdominal pain, constipation, diarrhea, heartburn, melena, nausea and vomiting.  Genitourinary:  Negative for dysuria, frequency and urgency.  Musculoskeletal:  Negative for back pain, joint pain and myalgias.  Skin: Negative.   Psychiatric/Behavioral:  Positive for memory loss. Negative for depression. The patient has insomnia. The patient is not nervous/anxious.        Objective:   BP 130/74   Pulse 68   Ht  (1.626 m)   Wt 203 lb 3.2  oz (92.2 kg)   SpO2 98%   BMI 34.88 kg/m   Vitals:   03/14/23 1330  BP: 130/74  Pulse: 68  Height:  (1.626 m)  Weight: 203 lb 3.2 oz (92.2 kg)  SpO2: 98%  BMI (Calculated): 34.86    Physical Exam Vitals and nursing note reviewed.  Constitutional:      Appearance: Normal appearance. She is obese.  Cardiovascular:     Rate and Rhythm: Normal rate and regular rhythm.     Pulses: Normal pulses.     Heart sounds: Normal heart sounds.  Pulmonary:     Effort: Pulmonary effort is normal.     Breath sounds: Normal breath sounds.  Abdominal:     General: Bowel sounds are normal.     Palpations: Abdomen is soft.  Musculoskeletal:        General: Normal range of  motion.     Cervical back: Normal range of motion and neck supple.  Skin:    General: Skin is warm.  Neurological:     Mental Status: She is alert. She is disoriented.  Psychiatric:        Mood and Affect: Mood normal.        Behavior: Behavior normal.      Results for orders placed or performed in visit on 03/14/23  POC CREATINE & ALBUMIN,URINE  Result Value Ref Range   Microalbumin Ur, POC 80 mg/L   Creatinine, POC 200 mg/dL   Albumin/Creatinine Ratio, Urine, POC 30-300     Recent Results (from the past 2160 hour(s))  POC CREATINE & ALBUMIN,URINE     Status: Abnormal   Collection Time: 03/14/23  1:54 PM  Result Value Ref Range   Microalbumin Ur, POC 80 mg/L   Creatinine, POC 200 mg/dL   Albumin/Creatinine Ratio, Urine, POC 30-300       Assessment & Plan:  La.  b work today.  Start Aricept 5 mg/day.  Schedule her mammogram bone density and colonoscopy. Problem List Items Addressed This Visit     Paroxysmal atrial fibrillation   Essential hypertension, benign   Relevant Orders   CMP14+EGFR   Other fatigue   Relevant Orders   TSH   CBC With Differential   Mixed hyperlipidemia   Relevant Orders   Lipid Panel w/o Chol/HDL Ratio   Vitamin D deficiency   Relevant Orders   Vitamin D (25 hydroxy)   Type 2 diabetes mellitus with hyperglycemia, without long-term current use of insulin - Primary   Relevant Orders   POC CREATINE & ALBUMIN,URINE (Completed)   Hemoglobin A1c   Memory impairment   Relevant Medications   donepezil (ARICEPT) 5 MG tablet   Other Relevant Orders   TSH   Vitamin B12 deficiency   Relevant Orders   Vitamin B12   Arthritis   Relevant Orders   Arthritis Panel   Other Visit Diagnoses     Screening mammogram for breast cancer       Relevant Orders   MM 3D SCREENING MAMMOGRAM BILATERAL BREAST   Screening for osteoporosis       Relevant Orders   DG Bone Density   Personal history of colonic polyps       Relevant Orders   Ambulatory  referral to Gastroenterology       Return in about 4 weeks (around 04/11/2023).   Total time spent: 40 minutes  Margaretann Loveless, MD  03/14/2023

## 2023-03-15 LAB — CBC WITH DIFFERENTIAL
Basophils Absolute: 0 10*3/uL (ref 0.0–0.2)
Basos: 1 %
EOS (ABSOLUTE): 0.3 10*3/uL (ref 0.0–0.4)
Eos: 3 %
Hematocrit: 39.5 % (ref 34.0–46.6)
Hemoglobin: 12.3 g/dL (ref 11.1–15.9)
Immature Grans (Abs): 0 10*3/uL (ref 0.0–0.1)
Immature Granulocytes: 0 %
Lymphocytes Absolute: 2 10*3/uL (ref 0.7–3.1)
Lymphs: 27 %
MCH: 26.1 pg — ABNORMAL LOW (ref 26.6–33.0)
MCHC: 31.1 g/dL — ABNORMAL LOW (ref 31.5–35.7)
MCV: 84 fL (ref 79–97)
Monocytes Absolute: 0.8 10*3/uL (ref 0.1–0.9)
Monocytes: 10 %
Neutrophils Absolute: 4.3 10*3/uL (ref 1.4–7.0)
Neutrophils: 59 %
RBC: 4.72 x10E6/uL (ref 3.77–5.28)
RDW: 13.6 % (ref 11.7–15.4)
WBC: 7.3 10*3/uL (ref 3.4–10.8)

## 2023-03-15 LAB — CMP14+EGFR
ALT: 11 IU/L (ref 0–32)
AST: 12 IU/L (ref 0–40)
Albumin/Globulin Ratio: 1.2 (ref 1.2–2.2)
Albumin: 3.8 g/dL (ref 3.8–4.8)
Alkaline Phosphatase: 141 IU/L — ABNORMAL HIGH (ref 44–121)
BUN/Creatinine Ratio: 19 (ref 12–28)
BUN: 16 mg/dL (ref 8–27)
Bilirubin Total: <0.2 mg/dL (ref 0.0–1.2)
CO2: 24 mmol/L (ref 20–29)
Calcium: 9.2 mg/dL (ref 8.7–10.3)
Chloride: 99 mmol/L (ref 96–106)
Creatinine, Ser: 0.83 mg/dL (ref 0.57–1.00)
Globulin, Total: 3.3 g/dL (ref 1.5–4.5)
Glucose: 235 mg/dL — ABNORMAL HIGH (ref 70–99)
Potassium: 4.1 mmol/L (ref 3.5–5.2)
Sodium: 137 mmol/L (ref 134–144)
Total Protein: 7.1 g/dL (ref 6.0–8.5)
eGFR: 73 mL/min/{1.73_m2} (ref >59)

## 2023-03-15 LAB — VITAMIN B12: Vitamin B-12: 321 pg/mL (ref 232–1245)

## 2023-03-15 LAB — LIPID PANEL W/O CHOL/HDL RATIO
Cholesterol, Total: 157 mg/dL (ref 100–199)
HDL: 58 mg/dL (ref >39)
LDL Chol Calc (NIH): 84 mg/dL (ref 0–99)
Triglycerides: 81 mg/dL (ref 0–149)
VLDL Cholesterol Cal: 15 mg/dL (ref 5–40)

## 2023-03-15 LAB — ARTHRITIS PANEL
Anti Nuclear Antibody (ANA): NEGATIVE
Rheumatoid fact SerPl-aCnc: <10 IU/mL (ref <14.0)
Sed Rate: 72 mm/hr — ABNORMAL HIGH (ref 0–40)
Uric Acid: 4.9 mg/dL (ref 3.1–7.9)

## 2023-03-15 LAB — HEMOGLOBIN A1C
Est. average glucose Bld gHb Est-mCnc: 229 mg/dL
Hgb A1c MFr Bld: 9.6 % — ABNORMAL HIGH (ref 4.8–5.6)

## 2023-03-15 LAB — TSH: TSH: 0.784 u[IU]/mL (ref 0.450–4.500)

## 2023-03-15 LAB — VITAMIN D 25 HYDROXY (VIT D DEFICIENCY, FRACTURES): Vit D, 25-Hydroxy: 6.7 ng/mL — ABNORMAL LOW (ref 30.0–100.0)

## 2023-03-18 ENCOUNTER — Other Ambulatory Visit: Payer: Self-pay

## 2023-03-18 DIAGNOSIS — E559 Vitamin D deficiency, unspecified: Secondary | ICD-10-CM

## 2023-03-18 MED ORDER — VITAMIN D (ERGOCALCIFEROL) 1.25 MG (50000 UNIT) PO CAPS
50000.0000 [IU] | ORAL_CAPSULE | ORAL | 3 refills | Status: DC
Start: 2023-03-18 — End: 2023-07-04

## 2023-03-18 NOTE — Progress Notes (Signed)
Pt's daughter informed, rx vitamin d sent

## 2023-04-08 ENCOUNTER — Ambulatory Visit (INDEPENDENT_AMBULATORY_CARE_PROVIDER_SITE_OTHER): Payer: 59

## 2023-04-08 ENCOUNTER — Encounter: Payer: Self-pay | Admitting: Internal Medicine

## 2023-04-08 ENCOUNTER — Ambulatory Visit (INDEPENDENT_AMBULATORY_CARE_PROVIDER_SITE_OTHER): Payer: 59 | Admitting: Internal Medicine

## 2023-04-08 VITALS — BP 132/72 | HR 64 | Ht 64.0 in | Wt 196.8 lb

## 2023-04-08 DIAGNOSIS — E782 Mixed hyperlipidemia: Secondary | ICD-10-CM | POA: Diagnosis not present

## 2023-04-08 DIAGNOSIS — E1165 Type 2 diabetes mellitus with hyperglycemia: Secondary | ICD-10-CM

## 2023-04-08 DIAGNOSIS — R413 Other amnesia: Secondary | ICD-10-CM | POA: Diagnosis not present

## 2023-04-08 DIAGNOSIS — E559 Vitamin D deficiency, unspecified: Secondary | ICD-10-CM

## 2023-04-08 DIAGNOSIS — I480 Paroxysmal atrial fibrillation: Secondary | ICD-10-CM

## 2023-04-08 DIAGNOSIS — M8589 Other specified disorders of bone density and structure, multiple sites: Secondary | ICD-10-CM | POA: Diagnosis not present

## 2023-04-08 DIAGNOSIS — I10 Essential (primary) hypertension: Secondary | ICD-10-CM

## 2023-04-08 DIAGNOSIS — Z13820 Encounter for screening for osteoporosis: Secondary | ICD-10-CM | POA: Diagnosis not present

## 2023-04-08 LAB — POCT CBG (FASTING - GLUCOSE)-MANUAL ENTRY: Glucose Fasting, POC: 134 mg/dL — AB (ref 70–99)

## 2023-04-08 MED ORDER — ATORVASTATIN CALCIUM 80 MG PO TABS: 80.0000 mg | ORAL_TABLET | Freq: Every day | ORAL | 3 refills | Status: DC

## 2023-04-08 MED ORDER — LISINOPRIL-HYDROCHLOROTHIAZIDE 20-12.5 MG PO TABS
1.0000 | ORAL_TABLET | Freq: Every day | ORAL | 11 refills | Status: DC
Start: 2023-04-08 — End: 2023-07-04

## 2023-04-08 NOTE — Progress Notes (Addendum)
Established Patient Office Visit  Subjective:  Patient ID: Gloria Blair, female    DOB: 03-09-1946  Age: 77 y.o. MRN: [id number]  Chief Complaint  Patient presents with   Follow-up    4 week follow up    Patient comes in with her daughter today for follow-up.  She just had a bone density test done which shows normal. Patient was started on Aricept 5 mg at her last visit and the family notes that she is doing much better than before.  She is more alert and responsive and is not having trouble sleeping at night.  Patient herself looks more alert and answers questions appropriately today. Patient had blood work done last time her hemoglobin A1c was very high at 9.6. Since then her Levemir insulin has been increased to 25 units.  Today they bring in a bottle of Janumet 50/1000 which patient is taking 1 tablet twice a day.  She is no longer on glipizide so it was taken off her list. Her vitamin D levels were very low so she is taking a supplement once a week. She has an appointment to see GI for colonoscopy in 2 months, she has a personal history of colon polyps.    No other concerns at this time.   Past Medical History:  Diagnosis Date   Asthma    Diabetes mellitus without complication (HCC)    GERD (gastroesophageal reflux disease)    Hypertension    Obesity     Past Surgical History:  Procedure Laterality Date   COLONOSCOPY WITH PROPOFOL N/A 08/07/2015   Procedure: COLONOSCOPY WITH PROPOFOL;  Surgeon: Elnita Maxwell, MD;  Location: [facility] ENDOSCOPY;  Service: Endoscopy;  Laterality: N/A;    Social History   Socioeconomic History   Marital status: Widowed    Spouse name: Not on file   Number of children: Not on file   Years of education: Not on file   Highest education level: Not on file  Occupational History   Not on file  Tobacco Use   Smoking status: Never   Smokeless tobacco: Never  Substance and Sexual Activity   Alcohol use: Not on file   Drug use: Not on  file   Sexual activity: Not on file  Other Topics Concern   Not on file  Social History Narrative   Not on file   Social Determinants of Health   Financial Resource Strain: Not on file  Food Insecurity: Not on file  Transportation Needs: Not on file  Physical Activity: Not on file  Stress: Not on file  Social Connections: Not on file  Intimate Partner Violence: Not on file    Family History  Problem Relation Age of Onset   Breast cancer Neg Hx     No Known Allergies  Review of Systems  Constitutional:  Negative for chills, diaphoresis, fever, malaise/fatigue and weight loss.  HENT:  Negative for congestion, hearing loss, sinus pain, sore throat and tinnitus.   Eyes:  Negative for blurred vision, double vision, discharge and redness.  Respiratory:  Negative for cough, shortness of breath and wheezing.   Cardiovascular:  Negative for chest pain, palpitations, leg swelling and PND.  Gastrointestinal:  Negative for abdominal pain, blood in stool, constipation, heartburn, melena, nausea and vomiting.  Genitourinary:  Negative for dysuria, flank pain, frequency and urgency.  Musculoskeletal:  Negative for back pain, joint pain, myalgias and neck pain.  Skin: Negative.   Neurological:  Negative for dizziness, tingling, sensory change, speech  change, focal weakness and headaches.  Psychiatric/Behavioral:  Negative for depression, memory loss and substance abuse. The patient is not nervous/anxious and does not have insomnia.        Objective:   BP 132/72   Pulse 64   Ht 5\' 4"  (1.626 m)   Wt 196 lb 12.8 oz (89.3 kg)   SpO2 98%   BMI 33.78 kg/m   Vitals:   04/08/23 1404  BP: 132/72  Pulse: 64  Height: 5\' 4"  (1.626 m)  Weight: 196 lb 12.8 oz (89.3 kg)  SpO2: 98%  BMI (Calculated): 33.76    Physical Exam Vitals and nursing note reviewed.  Constitutional:      Appearance: Normal appearance.  HENT:     Head: Normocephalic and atraumatic.     Nose: Nose normal.   Eyes:     Conjunctiva/sclera: Conjunctivae normal.  Cardiovascular:     Rate and Rhythm: Normal rate and regular rhythm.     Pulses: Normal pulses.     Heart sounds: Normal heart sounds.  Pulmonary:     Effort: Pulmonary effort is normal.     Breath sounds: No wheezing, rhonchi or rales.  Abdominal:     General: Bowel sounds are normal. There is no distension.     Palpations: Abdomen is soft.     Tenderness: There is no abdominal tenderness. There is no guarding.  Musculoskeletal:        General: No swelling or tenderness. Normal range of motion.     Cervical back: Normal range of motion and neck supple.     Right lower leg: No edema.     Left lower leg: No edema.  Skin:    General: Skin is warm and dry.     Coloration: Skin is not jaundiced.  Neurological:     General: No focal deficit present.     Mental Status: She is alert and oriented to person, place, and time.  Psychiatric:        Mood and Affect: Mood normal.        Behavior: Behavior normal.      Results for orders placed or performed in visit on 04/08/23  POCT CBG (Fasting - Glucose)  Result Value Ref Range   Glucose Fasting, POC 134 (A) 70 - 99 mg/dL    Recent Results (from the past 2160 hour(s))  POC CREATINE & ALBUMIN,URINE     Status: Abnormal   Collection Time: 03/14/23  1:54 PM  Result Value Ref Range   Microalbumin Ur, POC 80 mg/L   Creatinine, POC 200 mg/dL   Albumin/Creatinine Ratio, Urine, POC 30-300   CMP14+EGFR     Status: Abnormal   Collection Time: 03/14/23  2:12 PM  Result Value Ref Range   Glucose 235 (H) 70 - 99 mg/dL   BUN 16 8 - 27 mg/dL   Creatinine, Ser 1.61 0.57 - 1.00 mg/dL   eGFR 73 >09 UE/AVW/0.98   BUN/Creatinine Ratio 19 12 - 28   Sodium 137 134 - 144 mmol/L   Potassium 4.1 3.5 - 5.2 mmol/L   Chloride 99 96 - 106 mmol/L   CO2 24 20 - 29 mmol/L   Calcium 9.2 8.7 - 10.3 mg/dL   Total Protein 7.1 6.0 - 8.5 g/dL   Albumin 3.8 3.8 - 4.8 g/dL   Globulin, Total 3.3 1.5 - 4.5  g/dL   Albumin/Globulin Ratio 1.2 1.2 - 2.2   Bilirubin Total <0.2 0.0 - 1.2 mg/dL   Alkaline Phosphatase 141 (H)  44 - 121 IU/L   AST 12 0 - 40 IU/L   ALT 11 0 - 32 IU/L  Lipid Panel w/o Chol/HDL Ratio     Status: None   Collection Time: 03/14/23  2:12 PM  Result Value Ref Range   Cholesterol, Total 157 100 - 199 mg/dL   Triglycerides 81 0 - 149 mg/dL   HDL 58 >57 mg/dL   VLDL Cholesterol Cal 15 5 - 40 mg/dL   LDL Chol Calc (NIH) 84 0 - 99 mg/dL  Hemoglobin Q4O     Status: Abnormal   Collection Time: 03/14/23  2:12 PM  Result Value Ref Range   Hgb A1c MFr Bld 9.6 (H) 4.8 - 5.6 %    Comment:          Prediabetes: 5.7 - 6.4          Diabetes: >6.4          Glycemic control for adults with diabetes: <7.0    Est. average glucose Bld gHb Est-mCnc 229 mg/dL  TSH     Status: None   Collection Time: 03/14/23  2:12 PM  Result Value Ref Range   TSH 0.784 0.450 - 4.500 uIU/mL  CBC With Differential     Status: Abnormal   Collection Time: 03/14/23  2:12 PM  Result Value Ref Range   WBC 7.3 3.4 - 10.8 x10E3/uL   RBC 4.72 3.77 - 5.28 x10E6/uL   Hemoglobin 12.3 11.1 - 15.9 g/dL   Hematocrit 96.2 95.2 - 46.6 %   MCV 84 79 - 97 fL   MCH 26.1 (L) 26.6 - 33.0 pg   MCHC 31.1 (L) 31.5 - 35.7 g/dL   RDW 84.1 32.4 - 40.1 %   Neutrophils 59 Not Estab. %   Lymphs 27 Not Estab. %   Monocytes 10 Not Estab. %   Eos 3 Not Estab. %   Basos 1 Not Estab. %   Neutrophils Absolute 4.3 1.4 - 7.0 x10E3/uL   Lymphocytes Absolute 2.0 0.7 - 3.1 x10E3/uL   Monocytes Absolute 0.8 0.1 - 0.9 x10E3/uL   EOS (ABSOLUTE) 0.3 0.0 - 0.4 x10E3/uL   Basophils Absolute 0.0 0.0 - 0.2 x10E3/uL   Immature Granulocytes 0 Not Estab. %   Immature Grans (Abs) 0.0 0.0 - 0.1 x10E3/uL  Vitamin B12     Status: None   Collection Time: 03/14/23  2:12 PM  Result Value Ref Range   Vitamin B-12 321 232 - 1,245 pg/mL  Vitamin D (25 hydroxy)     Status: Abnormal   Collection Time: 03/14/23  2:12 PM  Result Value Ref Range   Vit  D, 25-Hydroxy 6.7 (L) 30.0 - 100.0 ng/mL    Comment: Vitamin D deficiency has been defined by the Institute of Medicine and an Endocrine Society practice guideline as a level of serum 25-OH vitamin D less than 20 ng/mL (1,2). The Endocrine Society went on to further define vitamin D insufficiency as a level between 21 and 29 ng/mL (2). 1. IOM (Institute of Medicine). 2010. Dietary reference    intakes for calcium and D. [place]: The    [employer]. 2. Holick MF, Binkley Vidor, Bischoff-Ferrari HA, et al.    Evaluation, treatment, and prevention of vitamin D    deficiency: an Endocrine Society clinical practice    guideline. JCEM. 2011 Jul; 96(7):1911-30.   Arthritis Panel     Status: Abnormal   Collection Time: 03/14/23  2:12 PM  Result Value Ref Range  Uric Acid 4.9 3.1 - 7.9 mg/dL    Comment:            Therapeutic target for gout patients: <6.0   Anti Nuclear Antibody (ANA) Negative Negative   Rheumatoid fact SerPl-aCnc <10.0 <14.0 IU/mL   Sed Rate 72 (H) 0 - 40 mm/hr  POCT CBG (Fasting - Glucose)     Status: Abnormal   Collection Time: 04/08/23  2:07 PM  Result Value Ref Range   Glucose Fasting, POC 134 (A) 70 - 99 mg/dL      Assessment & Plan:  Patient will continue her current regimen at this time. Strict diet control emphasized.  Will stay on Aricept 5 mg/day for now. Problem List Items Addressed This Visit     Paroxysmal atrial fibrillation (HCC)   Relevant Medications   atorvastatin (LIPITOR) 80 MG tablet   lisinopril-hydrochlorothiazide (ZESTORETIC) 20-12.5 MG tablet   Essential hypertension, benign   Relevant Medications   atorvastatin (LIPITOR) 80 MG tablet   lisinopril-hydrochlorothiazide (ZESTORETIC) 20-12.5 MG tablet   Mixed hyperlipidemia   Relevant Medications   atorvastatin (LIPITOR) 80 MG tablet   lisinopril-hydrochlorothiazide (ZESTORETIC) 20-12.5 MG tablet   Vitamin D deficiency   Type 2 diabetes mellitus with hyperglycemia, without  long-term current use of insulin (HCC) - Primary   Relevant Medications   atorvastatin (LIPITOR) 80 MG tablet   lisinopril-hydrochlorothiazide (ZESTORETIC) 20-12.5 MG tablet   sitaGLIPtin-metformin (JANUMET) 50-1000 MG tablet   Other Relevant Orders   POCT CBG (Fasting - Glucose) (Completed)   Memory impairment    Return in about 6 weeks (around 05/20/2023).   Total time spent: 30 minutes  Margaretann Loveless, MD  04/08/2023

## 2023-04-29 ENCOUNTER — Ambulatory Visit
Admission: RE | Admit: 2023-04-29 | Discharge: 2023-04-29 | Disposition: A | Discharge status: Home / Self Care | Payer: 59 | Source: Ambulatory Visit | Attending: Internal Medicine | Admitting: Internal Medicine

## 2023-04-29 DIAGNOSIS — Z1231 Encounter for screening mammogram for malignant neoplasm of breast: Secondary | ICD-10-CM | POA: Diagnosis present

## 2023-04-30 ENCOUNTER — Other Ambulatory Visit: Payer: Self-pay

## 2023-04-30 DIAGNOSIS — E1165 Type 2 diabetes mellitus with hyperglycemia: Secondary | ICD-10-CM

## 2023-05-01 MED ORDER — INSULIN DETEMIR 100 UNIT/ML ~~LOC~~ SOLN: 25.0000 [IU] | Freq: Every day | SUBCUTANEOUS | 3 refills | Status: DC

## 2023-05-20 ENCOUNTER — Ambulatory Visit (INDEPENDENT_AMBULATORY_CARE_PROVIDER_SITE_OTHER): Payer: 59 | Admitting: Internal Medicine

## 2023-05-20 ENCOUNTER — Encounter: Payer: Self-pay | Admitting: Internal Medicine

## 2023-05-20 VITALS — BP 122/78 | HR 66 | Ht 66.0 in | Wt 189.8 lb

## 2023-05-20 DIAGNOSIS — I10 Essential (primary) hypertension: Secondary | ICD-10-CM

## 2023-05-20 DIAGNOSIS — E559 Vitamin D deficiency, unspecified: Secondary | ICD-10-CM

## 2023-05-20 DIAGNOSIS — E1165 Type 2 diabetes mellitus with hyperglycemia: Secondary | ICD-10-CM

## 2023-05-20 DIAGNOSIS — M1000 Idiopathic gout, unspecified site: Secondary | ICD-10-CM

## 2023-05-20 DIAGNOSIS — E782 Mixed hyperlipidemia: Secondary | ICD-10-CM

## 2023-05-20 DIAGNOSIS — R413 Other amnesia: Secondary | ICD-10-CM

## 2023-05-20 LAB — POCT CBG (FASTING - GLUCOSE)-MANUAL ENTRY: Glucose Fasting, POC: 129 mg/dL — AB (ref 70–99)

## 2023-05-20 NOTE — Progress Notes (Signed)
Established Patient Office Visit  Subjective:  Patient ID: Gloria Blair, female    DOB: [birth year]  Age: 77 y.o. MRN: [id number]  Chief Complaint  Patient presents with   Follow-up    6 week follow up    Patient in office for 6 week follow up accompanied by her daughter. Patient had eye exam beginning of 2024. Scheduled to see GI in August 2024.  Walking regularly with no shortness of breath, chest pain.  Patient was recently started on Aricept 5 mg. Daughter notes that she is doing much better than before.  She is more alert and responsive and is sleeping at night.  No blood work due at this time. Fasting blood sugar today slightly improved from previous visit. Will repeat fasting labs prior to next visit.     No other concerns at this time.   Past Medical History:  Diagnosis Date   Asthma    Diabetes mellitus without complication (HCC)    GERD (gastroesophageal reflux disease)    Hypertension    Obesity     Past Surgical History:  Procedure Laterality Date   COLONOSCOPY WITH PROPOFOL N/A 08/07/2015   Procedure: COLONOSCOPY WITH PROPOFOL;  Surgeon: Elnita Maxwell, MD;  Location: [facility] ENDOSCOPY;  Service: Endoscopy;  Laterality: N/A;    Social History   Socioeconomic History   Marital status: Widowed    Spouse name: Not on file   Number of children: Not on file   Years of education: Not on file   Highest education level: Not on file  Occupational History   Not on file  Tobacco Use   Smoking status: Never   Smokeless tobacco: Never  Substance and Sexual Activity   Alcohol use: Not on file   Drug use: Not on file   Sexual activity: Not on file  Other Topics Concern   Not on file  Social History Narrative   Not on file   Social Determinants of Health   Financial Resource Strain: Not on file  Food Insecurity: Not on file  Transportation Needs: Not on file  Physical Activity: Not on file  Stress: Not on file  Social Connections: Not on file   Intimate Partner Violence: Not on file    Family History  Problem Relation Age of Onset   Breast cancer Neg Hx     No Known Allergies  Review of Systems  Constitutional: Negative.  Negative for malaise/fatigue.  HENT: Negative.    Eyes: Negative.   Respiratory: Negative.  Negative for cough, shortness of breath and wheezing.   Cardiovascular: Negative.  Negative for chest pain, palpitations and leg swelling.  Gastrointestinal: Negative.  Negative for abdominal pain, constipation, diarrhea, heartburn, nausea and vomiting.  Genitourinary: Negative.  Negative for dysuria.  Musculoskeletal: Negative.  Negative for falls, joint pain and myalgias.  Skin: Negative.   Neurological: Negative.  Negative for dizziness, tingling, tremors and headaches.  Endo/Heme/Allergies: Negative.   Psychiatric/Behavioral: Negative.  Negative for depression. The patient is not nervous/anxious.        Objective:   BP 122/78   Pulse 66   Ht 5\' 6"  (1.676 m)   Wt 189 lb 12.8 oz (86.1 kg)   SpO2 97%   BMI 30.63 kg/m   Vitals:   05/20/23 0953  BP: 122/78  Pulse: 66  Height: 5\' 6"  (1.676 m)  Weight: 189 lb 12.8 oz (86.1 kg)  SpO2: 97%  BMI (Calculated): 30.65    Physical Exam Vitals and nursing note reviewed.  Constitutional:      General: She is not in acute distress.    Appearance: Normal appearance.  HENT:     Head: Normocephalic and atraumatic.     Nose: Nose normal.  Cardiovascular:     Rate and Rhythm: Normal rate and regular rhythm.     Heart sounds: Normal heart sounds.  Pulmonary:     Effort: Pulmonary effort is normal.     Breath sounds: Normal breath sounds.  Abdominal:     General: Bowel sounds are normal.     Palpations: Abdomen is soft.     Tenderness: There is no right CVA tenderness or left CVA tenderness.  Musculoskeletal:        General: Normal range of motion.     Cervical back: Normal range of motion.     Right lower leg: No edema.     Left lower leg: No  edema.  Skin:    General: Skin is warm and dry.  Neurological:     General: No focal deficit present.     Mental Status: She is alert and oriented to person, place, and time.  Psychiatric:        Mood and Affect: Mood normal.        Behavior: Behavior normal.        Thought Content: Thought content normal.      Results for orders placed or performed in visit on 05/20/23  POCT CBG (Fasting - Glucose)  Result Value Ref Range   Glucose Fasting, POC 129 (A) 70 - 99 mg/dL        Assessment & Plan:  Continue all medications as prescribed. Continue working on diet and exercise. Repeat fasting labs prior to next visit.   Problem List Items Addressed This Visit     Essential hypertension, benign   Relevant Orders   CBC With Differential   CMP14+EGFR   Mixed hyperlipidemia   Vitamin D deficiency   Relevant Orders   Vitamin D (25 hydroxy)   Type 2 diabetes mellitus with hyperglycemia, without long-term current use of insulin (HCC) - Primary   Relevant Orders   POCT CBG (Fasting - Glucose) (Completed)   CBC With Differential   CMP14+EGFR   HgB A1c   Memory impairment   Relevant Orders   CBC With Differential   Other Visit Diagnoses     Idiopathic gout, unspecified chronicity, unspecified site       Relevant Orders   CBC With Differential   Uric acid       Return in about 6 weeks (around 07/01/2023) for with fasting labs prior.   Total time spent: 30 minutes  Margaretann Loveless, MD  05/20/2023   This document may have been prepared by [facility] Voice Recognition software and as such may include unintentional dictation errors.

## 2023-05-21 ENCOUNTER — Emergency Department
Admission: EM | Admit: 2023-05-21 | Discharge: 2023-05-21 | Disposition: A | Discharge status: Home / Self Care | Payer: 59 | Attending: Emergency Medicine | Admitting: Emergency Medicine

## 2023-05-21 ENCOUNTER — Other Ambulatory Visit: Payer: Self-pay

## 2023-05-21 ENCOUNTER — Encounter: Payer: Self-pay | Admitting: Emergency Medicine

## 2023-05-21 DIAGNOSIS — X501XXA Overexertion from prolonged static or awkward postures, initial encounter: Secondary | ICD-10-CM | POA: Insufficient documentation

## 2023-05-21 DIAGNOSIS — M542 Cervicalgia: Secondary | ICD-10-CM | POA: Diagnosis present

## 2023-05-21 DIAGNOSIS — S161XXA Strain of muscle, fascia and tendon at neck level, initial encounter: Secondary | ICD-10-CM | POA: Diagnosis not present

## 2023-05-21 MED ORDER — HYDROCHLOROTHIAZIDE 12.5 MG PO TABS
12.5000 mg | ORAL_TABLET | Freq: Once | ORAL | Status: AC
  Administered 2023-05-21: 12.5 mg via ORAL
  Filled 2023-05-21: qty 1

## 2023-05-21 MED ORDER — KETOROLAC TROMETHAMINE 30 MG/ML IJ SOLN
30.0000 mg | Freq: Once | INTRAMUSCULAR | Status: AC
  Administered 2023-05-21: 30 mg via INTRAMUSCULAR
  Filled 2023-05-21: qty 1

## 2023-05-21 MED ORDER — ACETAMINOPHEN 325 MG PO TABS
650.0000 mg | ORAL_TABLET | Freq: Once | ORAL | Status: AC
  Administered 2023-05-21: 650 mg via ORAL
  Filled 2023-05-21: qty 2

## 2023-05-21 MED ORDER — LIDOCAINE 5 % EX PTCH
1.0000 | MEDICATED_PATCH | CUTANEOUS | Status: DC
  Administered 2023-05-21: 1 via TRANSDERMAL
  Filled 2023-05-21: qty 1

## 2023-05-21 MED ORDER — DIAZEPAM 2 MG PO TABS
2.0000 mg | ORAL_TABLET | Freq: Once | ORAL | Status: AC
  Administered 2023-05-21: 2 mg via ORAL
  Filled 2023-05-21: qty 1

## 2023-05-21 MED ORDER — LISINOPRIL 10 MG PO TABS
20.0000 mg | ORAL_TABLET | Freq: Once | ORAL | Status: AC
  Administered 2023-05-21: 20 mg via ORAL
  Filled 2023-05-21: qty 2

## 2023-05-21 NOTE — ED Triage Notes (Addendum)
Patient ambulatory to triage with steady gait, without difficulty or distress noted; pt registered at STAT desk by her son for c/o CP but pt adamantly denies any c/o CP and st "I just got a crook in my neck"; pt reports since yesterday having pain to rt side of neck with ROM; pt denies any other accomp symptoms, denies any injury

## 2023-05-21 NOTE — Discharge Instructions (Addendum)
Take acetaminophen 650 mg and ibuprofen 400 mg every 6 hours for pain for the next 2 to 3 days.  Take with food.  Your blood pressure was high in the emergency department.  I think this is a combination of your pain and the fact that you did not take your blood pressure medications this morning.  Please follow-up with your primary doctor to recheck your blood pressure and adjust your medications as needed.  See your doctor this week for follow-up appointment.

## 2023-05-21 NOTE — ED Provider Notes (Signed)
[facility] Provider Note    Event Date/Time   First MD Initiated Contact with Patient 05/21/23 0818     (approximate)   History   Torticollis   HPI  Gloria Blair is a 77 y.o. female   Past medical history of hypertension, diabetes, here with right neck pain after awkward positioning after sitting on the recliner yesterday afternoon.  Feels the right side of her neck is stiff and painful.  She reports no trauma associated no other pain or other acute medical complaints associated.  She did not take her blood pressure medications this morning.  Independent Historian contributed to assessment above: Her family members at bedside to corroborate information given above       Physical Exam   Triage Vital Signs: ED Triage Vitals [05/21/23 0456]  Enc Vitals Group     BP (!) 202/71     Pulse Rate 60     Resp 16     Temp 98 F (36.7 C)     Temp Source Oral     SpO2 98 %     Weight      Height      Head Circumference      Peak Flow      Pain Score 10     Pain Loc      Pain Edu?      Excl. in GC?     Most recent vital signs: Vitals:   05/21/23 0456  BP: (!) 202/71  Pulse: 60  Resp: 16  Temp: 98 F (36.7 C)  SpO2: 98%    General: Awake, no distress.  CV:  Good peripheral perfusion.  Resp:  Normal effort.  Abd:  No distention.  Other:  She is able to range her neck but feels pain on the right side when she does so.  She has tenderness to the right cervical paraspinal and right trapezius muscles.  She has no midline tenderness.  Neurovascular intact bilateral upper extremities.  Awake alert oriented.  Hypertensive.   ED Results / Procedures / Treatments   Labs (all labs ordered are listed, but only abnormal results are displayed) Labs Reviewed - No data to display     PROCEDURES:  Critical Care performed: No  Procedures   MEDICATIONS ORDERED IN ED: Medications  ketorolac (TORADOL) 30 MG/ML injection 30 mg (has no  administration in time range)  diazepam (VALIUM) tablet 2 mg (has no administration in time range)  acetaminophen (TYLENOL) tablet 650 mg (has no administration in time range)  lidocaine (LIDODERM) 5 % 1 patch (has no administration in time range)  lisinopril (ZESTRIL) tablet 20 mg (has no administration in time range)  hydrochlorothiazide (HYDRODIURIL) tablet 12.5 mg (has no administration in time range)    IMPRESSION / MDM / ASSESSMENT AND PLAN / ED COURSE  I reviewed the triage vital signs and the nursing notes.                                Patient's presentation is most consistent with acute complicated illness / injury requiring diagnostic workup.  Differential diagnosis includes, but is not limited to, torticollis, muscle strain, considered but I think less likely dissection, fractures, dislocation   MDM:    Clinical presentation exam and symptomatology most likely muscle strain, cervical strain, torticollis.  Will medicate and reassess and likely discharge with improvement in pain.  I considered but doubt vascular emergencies.  Her elevated blood pressure is likely in the setting of pain, and also not taking her antihypertensives this morning prior to coming to the emergency department.  Will treat her symptoms and administer her home medications.       FINAL CLINICAL IMPRESSION(S) / ED DIAGNOSES   Final diagnoses:  Cervical strain, acute, initial encounter     Rx / DC Orders   ED Discharge Orders     None        Note:  This document was prepared using Dragon voice recognition software and may include unintentional dictation errors.    Pilar Jarvis, MD 05/21/23 [phone]

## 2023-07-01 ENCOUNTER — Ambulatory Visit: Payer: 59 | Admitting: Internal Medicine

## 2023-07-01 ENCOUNTER — Other Ambulatory Visit: Payer: 59

## 2023-07-01 DIAGNOSIS — E559 Vitamin D deficiency, unspecified: Secondary | ICD-10-CM

## 2023-07-01 DIAGNOSIS — R413 Other amnesia: Secondary | ICD-10-CM

## 2023-07-01 DIAGNOSIS — I10 Essential (primary) hypertension: Secondary | ICD-10-CM

## 2023-07-01 DIAGNOSIS — E1165 Type 2 diabetes mellitus with hyperglycemia: Secondary | ICD-10-CM

## 2023-07-01 DIAGNOSIS — M1000 Idiopathic gout, unspecified site: Secondary | ICD-10-CM

## 2023-07-04 ENCOUNTER — Ambulatory Visit (INDEPENDENT_AMBULATORY_CARE_PROVIDER_SITE_OTHER): Payer: 59 | Admitting: Internal Medicine

## 2023-07-04 ENCOUNTER — Encounter: Payer: Self-pay | Admitting: Internal Medicine

## 2023-07-04 VITALS — BP 142/82 | HR 64 | Ht 65.0 in | Wt 183.2 lb

## 2023-07-04 DIAGNOSIS — E1165 Type 2 diabetes mellitus with hyperglycemia: Secondary | ICD-10-CM | POA: Diagnosis not present

## 2023-07-04 DIAGNOSIS — E559 Vitamin D deficiency, unspecified: Secondary | ICD-10-CM

## 2023-07-04 DIAGNOSIS — R413 Other amnesia: Secondary | ICD-10-CM | POA: Diagnosis not present

## 2023-07-04 DIAGNOSIS — E782 Mixed hyperlipidemia: Secondary | ICD-10-CM | POA: Diagnosis not present

## 2023-07-04 DIAGNOSIS — I10 Essential (primary) hypertension: Secondary | ICD-10-CM

## 2023-07-04 LAB — POCT CBG (FASTING - GLUCOSE)-MANUAL ENTRY: Glucose Fasting, POC: 114 mg/dL — AB (ref 70–99)

## 2023-07-04 MED ORDER — DONEPEZIL HCL 10 MG PO TABS
10.0000 mg | ORAL_TABLET | Freq: Every day | ORAL | 2 refills | Status: DC
Start: 2023-07-04 — End: 2023-11-03

## 2023-07-04 MED ORDER — VITAMIN D (ERGOCALCIFEROL) 1.25 MG (50000 UNIT) PO CAPS
50000.0000 [IU] | ORAL_CAPSULE | ORAL | 3 refills | Status: AC
Start: 2023-07-04 — End: ?

## 2023-07-04 MED ORDER — SITAGLIPTIN PHOS-METFORMIN HCL 50-1000 MG PO TABS: 1.0000 | ORAL_TABLET | Freq: Two times a day (BID) | ORAL | 3 refills | Status: DC

## 2023-07-04 MED ORDER — INSULIN DETEMIR 100 UNIT/ML ~~LOC~~ SOLN: 25.0000 [IU] | Freq: Every day | SUBCUTANEOUS | 3 refills | Status: AC

## 2023-07-04 MED ORDER — ATORVASTATIN CALCIUM 80 MG PO TABS: 80.0000 mg | ORAL_TABLET | Freq: Every day | ORAL | 3 refills | Status: DC

## 2023-07-04 MED ORDER — MELOXICAM 7.5 MG PO TABS: 7.5000 mg | ORAL_TABLET | Freq: Every day | ORAL | 3 refills | Status: DC

## 2023-07-04 MED ORDER — LISINOPRIL-HYDROCHLOROTHIAZIDE 20-12.5 MG PO TABS: 1.0000 | ORAL_TABLET | Freq: Every day | ORAL | 11 refills | Status: DC

## 2023-07-04 NOTE — Progress Notes (Signed)
Established Patient Office Visit  Subjective:  Patient ID: Gloria Blair, female    DOB: 1946/03/16  Age: 77 y.o. MRN: [id number]  Chief Complaint  Patient presents with   Follow-up    6 week follow up    Patient is accompanied by her daughter today.  She is feeling well and offers no new complaints.  She has actually lost some weight with proper diet control.  Her labs were done which were discussed today.  She shows an improvement in her hemoglobin A1c which is down to 7.3 now.  However daughter mentions that her memory is getting worse and 5 mg of Aricept is not enough.  Will increase the dose to 10 mg.   Her blood pressure is a little high but she has not taken her blood pressure medicine this morning.    No other concerns at this time.   Past Medical History:  Diagnosis Date   Asthma    Diabetes mellitus without complication (HCC)    GERD (gastroesophageal reflux disease)    Hypertension    Obesity     Past Surgical History:  Procedure Laterality Date   COLONOSCOPY WITH PROPOFOL N/A 08/07/2015   Procedure: COLONOSCOPY WITH PROPOFOL;  Surgeon: Elnita Maxwell, MD;  Location: [facility] ENDOSCOPY;  Service: Endoscopy;  Laterality: N/A;    Social History   Socioeconomic History   Marital status: Widowed    Spouse name: Not on file   Number of children: Not on file   Years of education: Not on file   Highest education level: Not on file  Occupational History   Not on file  Tobacco Use   Smoking status: Never   Smokeless tobacco: Never  Substance and Sexual Activity   Alcohol use: Not on file   Drug use: Not on file   Sexual activity: Not on file  Other Topics Concern   Not on file  Social History Narrative   Not on file   Social Determinants of Health   Financial Resource Strain: Not on file  Food Insecurity: Not on file  Transportation Needs: Not on file  Physical Activity: Not on file  Stress: Not on file  Social Connections: Not on file  Intimate  Partner Violence: Not on file    Family History  Problem Relation Age of Onset   Breast cancer Neg Hx     No Known Allergies  Review of Systems  Constitutional: Negative.   HENT: Negative.    Eyes: Negative.   Respiratory: Negative.  Negative for cough and shortness of breath.   Cardiovascular: Negative.  Negative for chest pain, palpitations and leg swelling.  Gastrointestinal: Negative.  Negative for abdominal pain, constipation, diarrhea, heartburn, nausea and vomiting.  Genitourinary: Negative.  Negative for dysuria and flank pain.  Musculoskeletal: Negative.  Negative for joint pain and myalgias.  Skin: Negative.   Neurological: Negative.  Negative for dizziness and headaches.  Endo/Heme/Allergies: Negative.   Psychiatric/Behavioral:  Positive for memory loss. Negative for depression and suicidal ideas. The patient is not nervous/anxious.        Objective:   BP (!) 142/82   Pulse 64   Ht 5\' 5"  (1.651 m)   Wt 183 lb 3.2 oz (83.1 kg)   SpO2 97%   BMI 30.49 kg/m   Vitals:   07/04/23 1058  BP: (!) 142/82  Pulse: 64  Height: 5\' 5"  (1.651 m)  Weight: 183 lb 3.2 oz (83.1 kg)  SpO2: 97%  BMI (Calculated): 30.49  Physical Exam Vitals and nursing note reviewed.  Constitutional:      Appearance: Normal appearance.  HENT:     Head: Normocephalic and atraumatic.     Nose: Nose normal.     Mouth/Throat:     Mouth: Mucous membranes are moist.     Pharynx: Oropharynx is clear.  Eyes:     Conjunctiva/sclera: Conjunctivae normal.     Pupils: Pupils are equal, round, and reactive to light.  Cardiovascular:     Rate and Rhythm: Normal rate and regular rhythm.     Pulses: Normal pulses.     Heart sounds: Normal heart sounds. No murmur heard. Pulmonary:     Effort: Pulmonary effort is normal.     Breath sounds: Normal breath sounds. No wheezing.  Abdominal:     General: Bowel sounds are normal.     Palpations: Abdomen is soft.     Tenderness: There is no  abdominal tenderness. There is no right CVA tenderness or left CVA tenderness.  Musculoskeletal:        General: Normal range of motion.     Cervical back: Normal range of motion.     Right lower leg: No edema.     Left lower leg: No edema.  Skin:    General: Skin is warm and dry.  Neurological:     General: No focal deficit present.     Mental Status: She is alert and oriented to person, place, and time.  Psychiatric:        Mood and Affect: Mood normal.        Behavior: Behavior normal.      Results for orders placed or performed in visit on 07/04/23  POCT CBG (Fasting - Glucose)  Result Value Ref Range   Glucose Fasting, POC 114 (A) 70 - 99 mg/dL    Recent Results (from the past 2160 hour(s))  POCT CBG (Fasting - Glucose)     Status: Abnormal   Collection Time: 04/08/23  2:07 PM  Result Value Ref Range   Glucose Fasting, POC 134 (A) 70 - 99 mg/dL  POCT CBG (Fasting - Glucose)     Status: Abnormal   Collection Time: 05/20/23 10:03 AM  Result Value Ref Range   Glucose Fasting, POC 129 (A) 70 - 99 mg/dL  Arthritis Panel     Status: None   Collection Time: 07/01/23 11:05 AM  Result Value Ref Range   Uric Acid 5.7 3.1 - 7.9 mg/dL    Comment:            Therapeutic target for gout patients: <6.0   Anti Nuclear Antibody (ANA) Negative Negative   Rheumatoid fact SerPl-aCnc <10.0 <14.0 IU/mL   Sed Rate 37 0 - 40 mm/hr  Vitamin D (25 hydroxy)     Status: None   Collection Time: 07/01/23 11:06 AM  Result Value Ref Range   Vit D, 25-Hydroxy 36.7 30.0 - 100.0 ng/mL    Comment: Vitamin D deficiency has been defined by the Institute of Medicine and an Endocrine Society practice guideline as a level of serum 25-OH vitamin D less than 20 ng/mL (1,2). The Endocrine Society went on to further define vitamin D insufficiency as a level between 21 and 29 ng/mL (2). 1. IOM (Institute of Medicine). 2010. Dietary reference    intakes for calcium and D. [place]: The    [employer]. 2. Holick MF, Binkley Cantrall, Bischoff-Ferrari HA, et al.    Evaluation, treatment, and prevention of  vitamin D    deficiency: an Endocrine Society clinical practice    guideline. JCEM. 2011 Jul; 96(7):1911-30.   HgB A1c     Status: Abnormal   Collection Time: 07/01/23 11:06 AM  Result Value Ref Range   Hgb A1c MFr Bld 7.3 (H) 4.8 - 5.6 %    Comment:          Prediabetes: 5.7 - 6.4          Diabetes: >6.4          Glycemic control for adults with diabetes: <7.0    Est. average glucose Bld gHb Est-mCnc 163 mg/dL  [id number]     Status: Abnormal   Collection Time: 07/01/23 11:06 AM  Result Value Ref Range   Glucose 143 (H) 70 - 99 mg/dL   BUN 10 8 - 27 mg/dL   Creatinine, Ser 2.59 0.57 - 1.00 mg/dL   eGFR 76 >56 LO/VFI/4.33   BUN/Creatinine Ratio 12 12 - 28   Sodium 138 134 - 144 mmol/L   Potassium 3.8 3.5 - 5.2 mmol/L   Chloride 101 96 - 106 mmol/L   CO2 23 20 - 29 mmol/L   Calcium 9.3 8.7 - 10.3 mg/dL   Total Protein 6.6 6.0 - 8.5 g/dL   Albumin 3.8 3.8 - 4.8 g/dL   Globulin, Total 2.8 1.5 - 4.5 g/dL   Bilirubin Total 0.3 0.0 - 1.2 mg/dL   Alkaline Phosphatase 106 44 - 121 IU/L   AST 15 0 - 40 IU/L   ALT 9 0 - 32 IU/L  CBC with Differential/Platelet     Status: None   Collection Time: 07/01/23 11:06 AM  Result Value Ref Range   WBC 6.2 3.4 - 10.8 x10E3/uL   RBC 4.56 3.77 - 5.28 x10E6/uL   Hemoglobin 12.2 11.1 - 15.9 g/dL   Hematocrit 29.5 18.8 - 46.6 %   MCV 83 79 - 97 fL   MCH 26.8 26.6 - 33.0 pg   MCHC 32.4 31.5 - 35.7 g/dL   RDW 41.6 60.6 - 30.1 %   Platelets 227 150 - 450 x10E3/uL   Neutrophils 61 Not Estab. %   Lymphs 26 Not Estab. %   Monocytes 10 Not Estab. %   Eos 2 Not Estab. %   Basos 1 Not Estab. %   Neutrophils Absolute 3.8 1.4 - 7.0 x10E3/uL   Lymphocytes Absolute 1.6 0.7 - 3.1 x10E3/uL   Monocytes Absolute 0.6 0.1 - 0.9 x10E3/uL   EOS (ABSOLUTE) 0.1 0.0 - 0.4 x10E3/uL   Basophils Absolute 0.0 0.0 - 0.2 x10E3/uL   Immature  Granulocytes 0 Not Estab. %   Immature Grans (Abs) 0.0 0.0 - 0.1 x10E3/uL  POCT CBG (Fasting - Glucose)     Status: Abnormal   Collection Time: 07/04/23 11:02 AM  Result Value Ref Range   Glucose Fasting, POC 114 (A) 70 - 99 mg/dL      Assessment & Plan:  Increase dose of Aricept to 10 mg/day.  Other meds refilled as well.  Continue diet control. Problem List Items Addressed This Visit     Essential hypertension, benign   Relevant Medications   lisinopril-hydrochlorothiazide (ZESTORETIC) 20-12.5 MG tablet   atorvastatin (LIPITOR) 80 MG tablet   Mixed hyperlipidemia   Relevant Medications   lisinopril-hydrochlorothiazide (ZESTORETIC) 20-12.5 MG tablet   atorvastatin (LIPITOR) 80 MG tablet   Vitamin D deficiency   Relevant Medications   Vitamin D, Ergocalciferol, (DRISDOL) 1.25 MG (50000 UNIT) CAPS capsule   Type 2  diabetes mellitus with hyperglycemia, without long-term current use of insulin (HCC) - Primary   Relevant Medications   sitaGLIPtin-metformin (JANUMET) 50-1000 MG tablet   lisinopril-hydrochlorothiazide (ZESTORETIC) 20-12.5 MG tablet   atorvastatin (LIPITOR) 80 MG tablet   insulin detemir (LEVEMIR) 100 UNIT/ML injection   Other Relevant Orders   POCT CBG (Fasting - Glucose) (Completed)   Memory impairment   Relevant Medications   donepezil (ARICEPT) 10 MG tablet    Return in about 3 months (around 10/04/2023).   Total time spent: 30 minutes  Margaretann Loveless, MD  07/04/2023   This document may have been prepared by [facility] Voice Recognition software and as such may include unintentional dictation errors.

## 2023-10-03 ENCOUNTER — Encounter: Payer: Self-pay | Admitting: Internal Medicine

## 2023-10-03 ENCOUNTER — Ambulatory Visit (INDEPENDENT_AMBULATORY_CARE_PROVIDER_SITE_OTHER): Payer: 59 | Admitting: Internal Medicine

## 2023-10-03 VITALS — BP 142/76 | HR 54 | Ht 65.0 in | Wt 184.6 lb

## 2023-10-03 DIAGNOSIS — E782 Mixed hyperlipidemia: Secondary | ICD-10-CM

## 2023-10-03 DIAGNOSIS — Z23 Encounter for immunization: Secondary | ICD-10-CM

## 2023-10-03 DIAGNOSIS — E1165 Type 2 diabetes mellitus with hyperglycemia: Secondary | ICD-10-CM

## 2023-10-03 DIAGNOSIS — I152 Hypertension secondary to endocrine disorders: Secondary | ICD-10-CM | POA: Insufficient documentation

## 2023-10-03 DIAGNOSIS — E1169 Type 2 diabetes mellitus with other specified complication: Secondary | ICD-10-CM | POA: Diagnosis not present

## 2023-10-03 DIAGNOSIS — E1159 Type 2 diabetes mellitus with other circulatory complications: Secondary | ICD-10-CM

## 2023-10-03 DIAGNOSIS — R413 Other amnesia: Secondary | ICD-10-CM

## 2023-10-03 LAB — POCT CBG (FASTING - GLUCOSE)-MANUAL ENTRY: Glucose Fasting, POC: 131 mg/dL — AB (ref 70–99)

## 2023-10-03 MED ORDER — AMLODIPINE BESYLATE 5 MG PO TABS
5.0000 mg | ORAL_TABLET | Freq: Every day | ORAL | 11 refills | Status: DC
Start: 2023-10-03 — End: 2023-11-03

## 2023-10-03 NOTE — Progress Notes (Signed)
Established Patient Office Visit  Subjective:  Patient ID: Gloria Blair, female    DOB: Mar 17, 1946  Age: 77 y.o. MRN: [id number]  Chief Complaint  Patient presents with   Follow-up    3 month follow up    Patient comes in for follow-up accompanied by her daughter.  She is in good spirits and is smiling.  Offers no new complaints.  However her blood pressure is high still.  She takes her medications regularly.  Will add Norvasc 5 mg once a day. Her Aricept dose was increased to 10 mg at the last visit, the daughter thinks she has some good days and some bad days.  Overall she is feeling quite well.  Will get labs done today.    No other concerns at this time.   Past Medical History:  Diagnosis Date   Asthma    Diabetes mellitus without complication (HCC)    GERD (gastroesophageal reflux disease)    Hypertension    Obesity     Past Surgical History:  Procedure Laterality Date   COLONOSCOPY WITH PROPOFOL N/A 08/07/2015   Procedure: COLONOSCOPY WITH PROPOFOL;  Surgeon: Elnita Maxwell, MD;  Location: [facility] ENDOSCOPY;  Service: Endoscopy;  Laterality: N/A;    Social History   Socioeconomic History   Marital status: Widowed    Spouse name: Not on file   Number of children: Not on file   Years of education: Not on file   Highest education level: Not on file  Occupational History   Not on file  Tobacco Use   Smoking status: Never   Smokeless tobacco: Never  Substance and Sexual Activity   Alcohol use: Not on file   Drug use: Not on file   Sexual activity: Not on file  Other Topics Concern   Not on file  Social History Narrative   Not on file   Social Determinants of Health   Financial Resource Strain: Not on file  Food Insecurity: Not on file  Transportation Needs: Not on file  Physical Activity: Not on file  Stress: Not on file  Social Connections: Not on file  Intimate Partner Violence: Not on file    Family History  Problem Relation Age of Onset    Breast cancer Neg Hx     No Known Allergies  Review of Systems  Constitutional: Negative.  Negative for chills, fever, malaise/fatigue and weight loss.  HENT: Negative.    Eyes: Negative.   Respiratory: Negative.  Negative for cough and shortness of breath.   Cardiovascular: Negative.  Negative for chest pain, palpitations and leg swelling.  Gastrointestinal: Negative.  Negative for abdominal pain, constipation, diarrhea, heartburn, nausea and vomiting.  Genitourinary: Negative.  Negative for dysuria and flank pain.  Musculoskeletal: Negative.  Negative for joint pain and myalgias.  Skin: Negative.   Neurological:  Positive for tingling. Negative for dizziness and headaches.  Endo/Heme/Allergies: Negative.   Psychiatric/Behavioral:  Positive for memory loss. Negative for depression and suicidal ideas. The patient is not nervous/anxious.        Objective:   BP (!) 142/76   Pulse (!) 54   Ht 5\' 5"  (1.651 m)   Wt 184 lb 9.6 oz (83.7 kg)   SpO2 100%   BMI 30.72 kg/m   Vitals:   10/03/23 0935  BP: (!) 142/76  Pulse: (!) 54  Height: 5\' 5"  (1.651 m)  Weight: 184 lb 9.6 oz (83.7 kg)  SpO2: 100%  BMI (Calculated): 30.72    Physical  Exam   Results for orders placed or performed in visit on 10/03/23  POCT CBG (Fasting - Glucose)  Result Value Ref Range   Glucose Fasting, POC 131 (A) 70 - 99 mg/dL    Recent Results (from the past 2160 hour(s))  POCT CBG (Fasting - Glucose)     Status: Abnormal   Collection Time: 10/03/23  9:42 AM  Result Value Ref Range   Glucose Fasting, POC 131 (A) 70 - 99 mg/dL      Assessment & Plan:  Add Norvasc to her medications.  Continue to monitor blood pressure.  Blood work today. Also gets flu vaccine. Problem List Items Addressed This Visit     Mixed hyperlipidemia   Relevant Medications   amLODipine (NORVASC) 5 MG tablet   Type 2 diabetes mellitus with hyperglycemia, without long-term current use of insulin (HCC)   Relevant Orders    POCT CBG (Fasting - Glucose) (Completed)   Hemoglobin A1c   Memory impairment   Hypertension associated with diabetes (HCC) - Primary   Relevant Medications   amLODipine (NORVASC) 5 MG tablet   Other Relevant Orders   CBC with Diff   CMP14+EGFR   Combined hyperlipidemia associated with type 2 diabetes mellitus (HCC)   Relevant Medications   amLODipine (NORVASC) 5 MG tablet   Other Relevant Orders   Lipid Panel w/o Chol/HDL Ratio   Other Visit Diagnoses     Need for immunization against influenza       Relevant Orders   Flu Vaccine Trivalent High Dose (Fluad) (Completed)       Return in about 3 weeks (around 10/24/2023).   Total time spent: 30 minutes  Margaretann Loveless, MD  10/03/2023   This document may have been prepared by [facility] Voice Recognition software and as such may include unintentional dictation errors.

## 2023-10-04 LAB — CBC WITH DIFFERENTIAL/PLATELET
Basophils Absolute: 0 10*3/uL (ref 0.0–0.2)
Basos: 0 %
EOS (ABSOLUTE): 0.1 10*3/uL (ref 0.0–0.4)
Eos: 2 %
Hematocrit: 40.3 % (ref 34.0–46.6)
Hemoglobin: 12.6 g/dL (ref 11.1–15.9)
Immature Grans (Abs): 0 10*3/uL (ref 0.0–0.1)
Immature Granulocytes: 0 %
Lymphocytes Absolute: 1.6 10*3/uL (ref 0.7–3.1)
Lymphs: 28 %
MCH: 27 pg (ref 26.6–33.0)
MCHC: 31.3 g/dL — ABNORMAL LOW (ref 31.5–35.7)
MCV: 86 fL (ref 79–97)
Monocytes Absolute: 0.6 10*3/uL (ref 0.1–0.9)
Monocytes: 10 %
Neutrophils Absolute: 3.5 10*3/uL (ref 1.4–7.0)
Neutrophils: 60 %
Platelets: 215 10*3/uL (ref 150–450)
RBC: 4.67 x10E6/uL (ref 3.77–5.28)
RDW: 13.8 % (ref 11.7–15.4)
WBC: 5.8 10*3/uL (ref 3.4–10.8)

## 2023-10-04 LAB — CMP14+EGFR
ALT: 11 [IU]/L (ref 0–32)
AST: 16 [IU]/L (ref 0–40)
Albumin: 3.7 g/dL — ABNORMAL LOW (ref 3.8–4.8)
Alkaline Phosphatase: 90 [IU]/L (ref 44–121)
BUN/Creatinine Ratio: 16 (ref 12–28)
BUN: 12 mg/dL (ref 8–27)
Bilirubin Total: 0.4 mg/dL (ref 0.0–1.2)
CO2: 24 mmol/L (ref 20–29)
Calcium: 9.6 mg/dL (ref 8.7–10.3)
Chloride: 100 mmol/L (ref 96–106)
Creatinine, Ser: 0.77 mg/dL (ref 0.57–1.00)
Globulin, Total: 3.1 g/dL (ref 1.5–4.5)
Glucose: 129 mg/dL — ABNORMAL HIGH (ref 70–99)
Potassium: 4 mmol/L (ref 3.5–5.2)
Sodium: 138 mmol/L (ref 134–144)
Total Protein: 6.8 g/dL (ref 6.0–8.5)
eGFR: 80 mL/min/{1.73_m2} (ref >59)

## 2023-10-04 LAB — HEMOGLOBIN A1C
Est. average glucose Bld gHb Est-mCnc: 174 mg/dL
Hgb A1c MFr Bld: 7.7 % — ABNORMAL HIGH (ref 4.8–5.6)

## 2023-10-04 LAB — LIPID PANEL W/O CHOL/HDL RATIO
Cholesterol, Total: 122 mg/dL (ref 100–199)
HDL: 54 mg/dL (ref >39)
LDL Chol Calc (NIH): 54 mg/dL (ref 0–99)
Triglycerides: 65 mg/dL (ref 0–149)
VLDL Cholesterol Cal: 14 mg/dL (ref 5–40)

## 2023-10-30 ENCOUNTER — Ambulatory Visit: Payer: 59 | Admitting: Internal Medicine

## 2023-11-03 ENCOUNTER — Ambulatory Visit (INDEPENDENT_AMBULATORY_CARE_PROVIDER_SITE_OTHER): Payer: 59 | Admitting: Internal Medicine

## 2023-11-03 ENCOUNTER — Encounter: Payer: Self-pay | Admitting: Internal Medicine

## 2023-11-03 VITALS — BP 140/70 | HR 72 | Ht 64.0 in | Wt 185.0 lb

## 2023-11-03 DIAGNOSIS — E1165 Type 2 diabetes mellitus with hyperglycemia: Secondary | ICD-10-CM | POA: Diagnosis not present

## 2023-11-03 DIAGNOSIS — E1159 Type 2 diabetes mellitus with other circulatory complications: Secondary | ICD-10-CM | POA: Diagnosis not present

## 2023-11-03 DIAGNOSIS — E782 Mixed hyperlipidemia: Secondary | ICD-10-CM

## 2023-11-03 DIAGNOSIS — I10 Essential (primary) hypertension: Secondary | ICD-10-CM | POA: Diagnosis not present

## 2023-11-03 DIAGNOSIS — E1169 Type 2 diabetes mellitus with other specified complication: Secondary | ICD-10-CM

## 2023-11-03 DIAGNOSIS — R413 Other amnesia: Secondary | ICD-10-CM | POA: Diagnosis not present

## 2023-11-03 DIAGNOSIS — I152 Hypertension secondary to endocrine disorders: Secondary | ICD-10-CM

## 2023-11-03 LAB — GLUCOSE, POCT (MANUAL RESULT ENTRY): POC Glucose: 103 mg/dL — AB (ref 70–99)

## 2023-11-03 MED ORDER — AMLODIPINE BESYLATE 5 MG PO TABS
5.0000 mg | ORAL_TABLET | Freq: Every day | ORAL | 3 refills | Status: AC
Start: 2023-11-03 — End: 2024-11-02

## 2023-11-03 MED ORDER — LISINOPRIL-HYDROCHLOROTHIAZIDE 20-12.5 MG PO TABS
1.0000 | ORAL_TABLET | Freq: Every day | ORAL | 3 refills | Status: AC
Start: 2023-11-03 — End: 2024-11-02

## 2023-11-03 MED ORDER — DONEPEZIL HCL 10 MG PO TABS
10.0000 mg | ORAL_TABLET | Freq: Every day | ORAL | 3 refills | Status: AC
Start: 2023-11-03 — End: 2024-11-02

## 2023-11-04 ENCOUNTER — Encounter: Payer: Self-pay | Admitting: Internal Medicine

## 2023-11-04 NOTE — Progress Notes (Signed)
Established Patient Office Visit  Subjective:  Patient ID: Gloria Blair, female    DOB: Jan 19, 1946  Age: 77 y.o. MRN: [id number]  Chief Complaint  Patient presents with   Follow-up    3 week    Patient comes in for her follow-up of blood pressure accompanied by her daughter.  She is in good spirits and is feeling well.  Offers no new complaints.  Her blood pressure is although better than before but still above normal.  Patient reports that she has not taken her Norvasc yet.  Although she took the other medications.  Denies any nausea vomiting, no headaches and no dizziness. Lab results discussed today. Patient advised to take her medications regularly and do not skip any doses.  Will get blood pressure monitoring at home as well.    No other concerns at this time.   Past Medical History:  Diagnosis Date   Asthma    Diabetes mellitus without complication (HCC)    GERD (gastroesophageal reflux disease)    Hypertension    Obesity     Past Surgical History:  Procedure Laterality Date   COLONOSCOPY WITH PROPOFOL N/A 08/07/2015   Procedure: COLONOSCOPY WITH PROPOFOL;  Surgeon: Elnita Maxwell, MD;  Location: [facility] ENDOSCOPY;  Service: Endoscopy;  Laterality: N/A;    Social History   Socioeconomic History   Marital status: Widowed    Spouse name: Not on file   Number of children: Not on file   Years of education: Not on file   Highest education level: Not on file  Occupational History   Not on file  Tobacco Use   Smoking status: Never   Smokeless tobacco: Never  Substance and Sexual Activity   Alcohol use: Not on file   Drug use: Not on file   Sexual activity: Not on file  Other Topics Concern   Not on file  Social History Narrative   Not on file   Social Determinants of Health   Financial Resource Strain: Not on file  Food Insecurity: Not on file  Transportation Needs: Not on file  Physical Activity: Not on file  Stress: Not on file  Social  Connections: Not on file  Intimate Partner Violence: Not on file    Family History  Problem Relation Age of Onset   Breast cancer Neg Hx     No Known Allergies  Outpatient Medications Prior to Visit  Medication Sig   ACCU-CHEK AVIVA PLUS test strip 1 each by Other route as needed.   albuterol (VENTOLIN HFA) 108 (90 Base) MCG/ACT inhaler Inhale 2 puffs into the lungs every 6 (six) hours as needed for wheezing.   aspirin 81 MG tablet Take 81 mg by mouth daily.   atorvastatin (LIPITOR) 80 MG tablet Take 1 tablet (80 mg total) by mouth daily.   geriatric multivitamins-minerals (ELDERTONIC/GEVRABON) ELIX Take 15 mLs by mouth daily.   insulin detemir (LEVEMIR) 100 UNIT/ML injection Inject 0.25 mLs (25 Units total) into the skin daily at 6 (six) AM.   meloxicam (MOBIC) 7.5 MG tablet Take 1 tablet (7.5 mg total) by mouth daily.   sitaGLIPtin-metformin (JANUMET) 50-1000 MG tablet Take 1 tablet by mouth 2 (two) times daily with a meal.   vitamin C (ASCORBIC ACID) 250 MG tablet Take 1 tablet (250 mg total) by mouth daily.   Vitamin D, Ergocalciferol, (DRISDOL) 1.25 MG (50000 UNIT) CAPS capsule Take 1 capsule (50,000 Units total) by mouth every 7 (seven) days.   [DISCONTINUED] amLODipine (NORVASC) 5  MG tablet Take 1 tablet (5 mg total) by mouth daily.   [DISCONTINUED] donepezil (ARICEPT) 10 MG tablet Take 1 tablet (10 mg total) by mouth at bedtime.   [DISCONTINUED] lisinopril-hydrochlorothiazide (ZESTORETIC) 20-12.5 MG tablet Take 1 tablet by mouth daily.   No facility-administered medications prior to visit.    Review of Systems  Constitutional: Negative.  Negative for chills, diaphoresis, fever, malaise/fatigue and weight loss.  HENT: Negative.  Negative for congestion, hearing loss and sore throat.   Eyes: Negative.   Respiratory: Negative.  Negative for cough and shortness of breath.   Cardiovascular: Negative.  Negative for chest pain, palpitations and leg swelling.  Gastrointestinal:  Negative.  Negative for abdominal pain, constipation, diarrhea, heartburn, nausea and vomiting.  Genitourinary: Negative.  Negative for dysuria and flank pain.  Musculoskeletal: Negative.  Negative for joint pain and myalgias.  Skin: Negative.   Neurological: Negative.  Negative for dizziness, tingling, tremors and headaches.  Endo/Heme/Allergies: Negative.   Psychiatric/Behavioral: Negative.  Negative for depression and suicidal ideas. The patient is not nervous/anxious.        Objective:   BP (!) 140/70   Pulse 72   Ht 5\' 4"  (1.626 m)   Wt 185 lb (83.9 kg)   SpO2 95%   BMI 31.76 kg/m   Vitals:   11/03/23 1530  BP: (!) 140/70  Pulse: 72  Height: 5\' 4"  (1.626 m)  Weight: 185 lb (83.9 kg)  SpO2: 95%  BMI (Calculated): 31.74    Physical Exam Vitals and nursing note reviewed.  Constitutional:      Appearance: Normal appearance.  HENT:     Head: Normocephalic and atraumatic.     Nose: Nose normal.     Mouth/Throat:     Mouth: Mucous membranes are moist.     Pharynx: Oropharynx is clear.  Eyes:     Conjunctiva/sclera: Conjunctivae normal.     Pupils: Pupils are equal, round, and reactive to light.  Cardiovascular:     Rate and Rhythm: Normal rate and regular rhythm.     Pulses: Normal pulses.     Heart sounds: Normal heart sounds. No murmur heard. Pulmonary:     Effort: Pulmonary effort is normal.     Breath sounds: Normal breath sounds. No wheezing.  Abdominal:     General: Bowel sounds are normal.     Palpations: Abdomen is soft.     Tenderness: There is no abdominal tenderness. There is no right CVA tenderness or left CVA tenderness.  Musculoskeletal:        General: Normal range of motion.     Cervical back: Normal range of motion.     Right lower leg: No edema.     Left lower leg: No edema.  Skin:    General: Skin is warm and dry.  Neurological:     General: No focal deficit present.     Mental Status: She is alert and oriented to person, place, and  time.  Psychiatric:        Mood and Affect: Mood normal.        Behavior: Behavior normal.      Results for orders placed or performed in visit on 11/03/23  POCT Glucose (CBG)  Result Value Ref Range   POC Glucose 103 (A) 70 - 99 mg/dl    Recent Results (from the past 2160 hour(s))  POCT CBG (Fasting - Glucose)     Status: Abnormal   Collection Time: 10/03/23  9:42 AM  Result Value Ref  Range   Glucose Fasting, POC 131 (A) 70 - 99 mg/dL  CBC with Diff     Status: Abnormal   Collection Time: 10/03/23 10:21 AM  Result Value Ref Range   WBC 5.8 3.4 - 10.8 x10E3/uL   RBC 4.67 3.77 - 5.28 x10E6/uL   Hemoglobin 12.6 11.1 - 15.9 g/dL   Hematocrit 16.1 09.6 - 46.6 %   MCV 86 79 - 97 fL   MCH 27.0 26.6 - 33.0 pg   MCHC 31.3 (L) 31.5 - 35.7 g/dL   RDW 04.5 40.9 - 81.1 %   Platelets 215 150 - 450 x10E3/uL   Neutrophils 60 Not Estab. %   Lymphs 28 Not Estab. %   Monocytes 10 Not Estab. %   Eos 2 Not Estab. %   Basos 0 Not Estab. %   Neutrophils Absolute 3.5 1.4 - 7.0 x10E3/uL   Lymphocytes Absolute 1.6 0.7 - 3.1 x10E3/uL   Monocytes Absolute 0.6 0.1 - 0.9 x10E3/uL   EOS (ABSOLUTE) 0.1 0.0 - 0.4 x10E3/uL   Basophils Absolute 0.0 0.0 - 0.2 x10E3/uL   Immature Granulocytes 0 Not Estab. %   Immature Grans (Abs) 0.0 0.0 - 0.1 x10E3/uL  Hemoglobin A1c     Status: Abnormal   Collection Time: 10/03/23 10:21 AM  Result Value Ref Range   Hgb A1c MFr Bld 7.7 (H) 4.8 - 5.6 %    Comment:          Prediabetes: 5.7 - 6.4          Diabetes: >6.4          Glycemic control for adults with diabetes: <7.0    Est. average glucose Bld gHb Est-mCnc 174 mg/dL  Lipid Panel w/o Chol/HDL Ratio     Status: None   Collection Time: 10/03/23 10:21 AM  Result Value Ref Range   Cholesterol, Total 122 100 - 199 mg/dL   Triglycerides 65 0 - 149 mg/dL   HDL 54 >91 mg/dL   VLDL Cholesterol Cal 14 5 - 40 mg/dL   LDL Chol Calc (NIH) 54 0 - 99 mg/dL  [id number]     Status: Abnormal   Collection Time:  10/03/23 10:21 AM  Result Value Ref Range   Glucose 129 (H) 70 - 99 mg/dL   BUN 12 8 - 27 mg/dL   Creatinine, Ser 3.08 0.57 - 1.00 mg/dL   eGFR 80 >65 HQ/ION/6.29   BUN/Creatinine Ratio 16 12 - 28   Sodium 138 134 - 144 mmol/L   Potassium 4.0 3.5 - 5.2 mmol/L   Chloride 100 96 - 106 mmol/L   CO2 24 20 - 29 mmol/L   Calcium 9.6 8.7 - 10.3 mg/dL   Total Protein 6.8 6.0 - 8.5 g/dL   Albumin 3.7 (L) 3.8 - 4.8 g/dL   Globulin, Total 3.1 1.5 - 4.5 g/dL   Bilirubin Total 0.4 0.0 - 1.2 mg/dL   Alkaline Phosphatase 90 44 - 121 IU/L   AST 16 0 - 40 IU/L   ALT 11 0 - 32 IU/L  POCT Glucose (CBG)     Status: Abnormal   Collection Time: 11/03/23  3:34 PM  Result Value Ref Range   POC Glucose 103 (A) 70 - 99 mg/dl      Assessment & Plan:  Continue all medications at home and should be taken regularly under supervision.  Monitor blood pressure at home. Problem List Items Addressed This Visit     Essential hypertension, benign   Relevant Medications  lisinopril-hydrochlorothiazide (ZESTORETIC) 20-12.5 MG tablet   amLODipine (NORVASC) 5 MG tablet   Type 2 diabetes mellitus with hyperglycemia, without long-term current use of insulin (HCC)   Relevant Medications   lisinopril-hydrochlorothiazide (ZESTORETIC) 20-12.5 MG tablet   Other Relevant Orders   POCT Glucose (CBG) (Completed)   Memory impairment   Relevant Medications   donepezil (ARICEPT) 10 MG tablet   Hypertension associated with diabetes (HCC) - Primary   Relevant Medications   lisinopril-hydrochlorothiazide (ZESTORETIC) 20-12.5 MG tablet   amLODipine (NORVASC) 5 MG tablet   Combined hyperlipidemia associated with type 2 diabetes mellitus (HCC)   Relevant Medications   lisinopril-hydrochlorothiazide (ZESTORETIC) 20-12.5 MG tablet   amLODipine (NORVASC) 5 MG tablet    Return in about 1 month (around 12/04/2023).   Total time spent: 30 minutes  Margaretann Loveless, MD  11/03/2023   This document may have been prepared by  [facility] Voice Recognition software and as such may include unintentional dictation errors.

## 2023-12-04 ENCOUNTER — Encounter: Payer: Self-pay | Admitting: Internal Medicine

## 2023-12-04 ENCOUNTER — Ambulatory Visit (INDEPENDENT_AMBULATORY_CARE_PROVIDER_SITE_OTHER): Payer: 59 | Admitting: Internal Medicine

## 2023-12-04 VITALS — BP 148/62 | HR 60 | Ht 66.0 in | Wt 185.6 lb

## 2023-12-04 DIAGNOSIS — E1165 Type 2 diabetes mellitus with hyperglycemia: Secondary | ICD-10-CM | POA: Diagnosis not present

## 2023-12-04 DIAGNOSIS — Z794 Long term (current) use of insulin: Secondary | ICD-10-CM

## 2023-12-04 DIAGNOSIS — E1169 Type 2 diabetes mellitus with other specified complication: Secondary | ICD-10-CM | POA: Diagnosis not present

## 2023-12-04 DIAGNOSIS — I152 Hypertension secondary to endocrine disorders: Secondary | ICD-10-CM

## 2023-12-04 DIAGNOSIS — E559 Vitamin D deficiency, unspecified: Secondary | ICD-10-CM

## 2023-12-04 DIAGNOSIS — E1159 Type 2 diabetes mellitus with other circulatory complications: Secondary | ICD-10-CM | POA: Diagnosis not present

## 2023-12-04 DIAGNOSIS — E782 Mixed hyperlipidemia: Secondary | ICD-10-CM

## 2023-12-04 DIAGNOSIS — M1000 Idiopathic gout, unspecified site: Secondary | ICD-10-CM

## 2023-12-04 LAB — POCT CBG (FASTING - GLUCOSE)-MANUAL ENTRY: Glucose Fasting, POC: 150 mg/dL — AB (ref 70–99)

## 2023-12-04 NOTE — Progress Notes (Signed)
 Established Patient Office Visit  Subjective:  Patient ID: Gloria Blair, female    DOB: 1946-01-31  Age: 78 y.o. MRN: [id number]  Chief Complaint  Patient presents with   Follow-up    1 month follow up    Patient comes in for her follow-up, accompanied by her daughter.  She is smiling and is in good spirits.  Her blood pressure is still slightly elevated, but patient did not take her medications as she is fasting for blood work.  Advised to check her blood pressure at home and call us .  She does not have any complaints whatsoever.    No other concerns at this time.   Past Medical History:  Diagnosis Date   Asthma    Diabetes mellitus without complication (HCC)    GERD (gastroesophageal reflux disease)    Hypertension    Obesity     Past Surgical History:  Procedure Laterality Date   COLONOSCOPY WITH PROPOFOL  N/A 08/07/2015   Procedure: COLONOSCOPY WITH PROPOFOL ;  Surgeon: Donnice Vaughn Manes, MD;  Location: [facility] END[facility];  Service: Endoscopy;  Laterality: N/A;    Social History   Socioeconomic History   Marital status: Widowed    Spouse name: Not on file   Number of children: Not on file   Years of education: Not on file   Highest education level: Not on file  Occupational History   Not on file  Tobacco Use   Smoking status: Never   Smokeless tobacco: Never  Substance and Sexual Activity   Alcohol use: Not on file   Drug use: Not on file   Sexual activity: Not on file  Other Topics Concern   Not on file  Social History Narrative   Not on file   Social Drivers of Health   Financial Resource Strain: Not on file  Food Insecurity: Not on file  Transportation Needs: Not on file  Physical Activity: Not on file  Stress: Not on file  Social Connections: Not on file  Intimate Partner Violence: Not on file    Family History  Problem Relation Age of Onset   Breast cancer Neg Hx     No Known Allergies  Outpatient Medications Prior to Visit  Medication  Sig   ACCU-CHEK AVIVA PLUS test strip 1 each by Other route as needed.   albuterol  (VENTOLIN  HFA) 108 (90 Base) MCG/ACT inhaler Inhale 2 puffs into the lungs every 6 (six) hours as needed for wheezing.   amLODipine  (NORVASC ) 5 MG tablet Take 1 tablet (5 mg total) by mouth daily.   aspirin  81 MG tablet Take 81 mg by mouth daily.   atorvastatin  (LIPITOR) 80 MG tablet Take 1 tablet (80 mg total) by mouth daily.   donepezil  (ARICEPT ) 10 MG tablet Take 1 tablet (10 mg total) by mouth at bedtime.   insulin  detemir (LEVEMIR ) 100 UNIT/ML injection Inject 0.25 mLs (25 Units total) into the skin daily at 6 (six) AM.   lisinopril -hydrochlorothiazide  (ZESTORETIC ) 20-12.5 MG tablet Take 1 tablet by mouth daily.   meloxicam  (MOBIC ) 7.5 MG tablet Take 1 tablet (7.5 mg total) by mouth daily.   sitaGLIPtin -metformin  (JANUMET ) 50-1000 MG tablet Take 1 tablet by mouth 2 (two) times daily with a meal.   vitamin C  (ASCORBIC ACID) 250 MG tablet Take 1 tablet (250 mg total) by mouth daily.   Vitamin D , Ergocalciferol , (DRISDOL ) 1.25 MG (50000 UNIT) CAPS capsule Take 1 capsule (50,000 Units total) by mouth every 7 (seven) days.   geriatric multivitamins-minerals (ELDERTONIC/GEVRABON)  ELIX Take 15 mLs by mouth daily. (Patient not taking: Reported on 12/04/2023)   No facility-administered medications prior to visit.    Review of Systems  Constitutional: Negative.  Negative for chills, diaphoresis, fever, malaise/fatigue and weight loss.  HENT: Negative.  Negative for congestion, ear discharge and nosebleeds.   Eyes: Negative.   Respiratory: Negative.  Negative for cough, shortness of breath and stridor.   Cardiovascular: Negative.  Negative for chest pain, palpitations and leg swelling.  Gastrointestinal: Negative.  Negative for abdominal pain, constipation, diarrhea, heartburn, nausea and vomiting.  Genitourinary: Negative.  Negative for dysuria and flank pain.  Musculoskeletal: Negative.  Negative for joint pain and  myalgias.  Skin: Negative.   Neurological:  Negative for dizziness, tingling, tremors, sensory change and headaches.  Endo/Heme/Allergies: Negative.   Psychiatric/Behavioral: Negative.  Negative for depression and suicidal ideas. The patient is not nervous/anxious.        Objective:   BP (!) 148/62   Pulse 60   Ht 5' 6 (1.676 m)   Wt 185 lb 9.6 oz (84.2 kg)   SpO2 95%   BMI 29.96 kg/m   Vitals:   12/04/23 1004  BP: (!) 148/62  Pulse: 60  Height: 5' 6 (1.676 m)  Weight: 185 lb 9.6 oz (84.2 kg)  SpO2: 95%  BMI (Calculated): 29.97    Physical Exam Vitals and nursing note reviewed.  Constitutional:      Appearance: Normal appearance.  HENT:     Head: Normocephalic and atraumatic.     Nose: Nose normal.     Mouth/Throat:     Mouth: Mucous membranes are moist.     Pharynx: Oropharynx is clear.  Eyes:     Conjunctiva/sclera: Conjunctivae normal.     Pupils: Pupils are equal, round, and reactive to light.  Cardiovascular:     Rate and Rhythm: Normal rate and regular rhythm.     Pulses: Normal pulses.     Heart sounds: Normal heart sounds. No murmur heard. Pulmonary:     Effort: Pulmonary effort is normal.     Breath sounds: Normal breath sounds. No wheezing.  Abdominal:     General: Bowel sounds are normal.     Palpations: Abdomen is soft.     Tenderness: There is no abdominal tenderness. There is no right CVA tenderness or left CVA tenderness.  Musculoskeletal:        General: Normal range of motion.     Cervical back: Normal range of motion.     Right lower leg: No edema.     Left lower leg: No edema.  Skin:    General: Skin is warm and dry.  Neurological:     General: No focal deficit present.     Mental Status: She is alert and oriented to person, place, and time.  Psychiatric:        Mood and Affect: Mood normal.        Behavior: Behavior normal.      Results for orders placed or performed in visit on 12/04/23  POCT CBG (Fasting - Glucose)   Result Value Ref Range   Glucose Fasting, POC 150 (A) 70 - 99 mg/dL    Recent Results (from the past 2160 hours)  POCT CBG (Fasting - Glucose)     Status: Abnormal   Collection Time: 10/03/23  9:42 AM  Result Value Ref Range   Glucose Fasting, POC 131 (A) 70 - 99 mg/dL  CBC with Diff     Status: Abnormal  Collection Time: 10/03/23 10:21 AM  Result Value Ref Range   WBC 5.8 3.4 - 10.8 x10E3/uL   RBC 4.67 3.77 - 5.28 x10E6/uL   Hemoglobin 12.6 11.1 - 15.9 g/dL   Hematocrit 59.6 65.9 - 46.6 %   MCV 86 79 - 97 fL   MCH 27.0 26.6 - 33.0 pg   MCHC 31.3 (L) 31.5 - 35.7 g/dL   RDW 86.1 88.2 - 84.5 %   Platelets 215 150 - 450 x10E3/uL   Neutrophils 60 Not Estab. %   Lymphs 28 Not Estab. %   Monocytes 10 Not Estab. %   Eos 2 Not Estab. %   Basos 0 Not Estab. %   Neutrophils Absolute 3.5 1.4 - 7.0 x10E3/uL   Lymphocytes Absolute 1.6 0.7 - 3.1 x10E3/uL   Monocytes Absolute 0.6 0.1 - 0.9 x10E3/uL   EOS (ABSOLUTE) 0.1 0.0 - 0.4 x10E3/uL   Basophils Absolute 0.0 0.0 - 0.2 x10E3/uL   Immature Granulocytes 0 Not Estab. %   Immature Grans (Abs) 0.0 0.0 - 0.1 x10E3/uL  Hemoglobin A1c     Status: Abnormal   Collection Time: 10/03/23 10:21 AM  Result Value Ref Range   Hgb A1c MFr Bld 7.7 (H) 4.8 - 5.6 %    Comment:          Prediabetes: 5.7 - 6.4          Diabetes: >6.4          Glycemic control for adults with diabetes: <7.0    Est. average glucose Bld gHb Est-mCnc 174 mg/dL  Lipid Panel w/o Chol/HDL Ratio     Status: None   Collection Time: 10/03/23 10:21 AM  Result Value Ref Range   Cholesterol, Total 122 100 - 199 mg/dL   Triglycerides 65 0 - 149 mg/dL   HDL 54 >60 mg/dL   VLDL Cholesterol Cal 14 5 - 40 mg/dL   LDL Chol Calc (NIH) 54 0 - 99 mg/dL  RFE85+ZHQM     Status: Abnormal   Collection Time: 10/03/23 10:21 AM  Result Value Ref Range   Glucose 129 (H) 70 - 99 mg/dL   BUN 12 8 - 27 mg/dL   Creatinine, Ser 9.22 0.57 - 1.00 mg/dL   eGFR 80 >40 fO/fpw/8.26   BUN/Creatinine  Ratio 16 12 - 28   Sodium 138 134 - 144 mmol/L   Potassium 4.0 3.5 - 5.2 mmol/L   Chloride 100 96 - 106 mmol/L   CO2 24 20 - 29 mmol/L   Calcium  9.6 8.7 - 10.3 mg/dL   Total Protein 6.8 6.0 - 8.5 g/dL   Albumin 3.7 (L) 3.8 - 4.8 g/dL   Globulin, Total 3.1 1.5 - 4.5 g/dL   Bilirubin Total 0.4 0.0 - 1.2 mg/dL   Alkaline Phosphatase 90 44 - 121 IU/L   AST 16 0 - 40 IU/L   ALT 11 0 - 32 IU/L  POCT Glucose (CBG)     Status: Abnormal   Collection Time: 11/03/23  3:34 PM  Result Value Ref Range   POC Glucose 103 (A) 70 - 99 mg/dl  POCT CBG (Fasting - Glucose)     Status: Abnormal   Collection Time: 12/04/23 10:07 AM  Result Value Ref Range   Glucose Fasting, POC 150 (A) 70 - 99 mg/dL      Assessment & Plan:  Patient advised to continue medications.  Monitor blood pressure at home.  Check labs today. Problem List Items Addressed This Visit  Vitamin D  deficiency   Type 2 diabetes mellitus with hyperglycemia, with long-term current use of insulin  (HCC)   Hypertension associated with diabetes (HCC) - Primary   Relevant Orders   CMP14+EGFR   Combined hyperlipidemia associated with type 2 diabetes mellitus (HCC)   Relevant Orders   Lipid Panel w/o Chol/HDL Ratio   Other Visit Diagnoses       Idiopathic gout, unspecified chronicity, unspecified site           Return in about 3 months (around 03/03/2024).   Total time spent: 30 minutes  FERNAND FREDY RAMAN, MD  12/04/2023   This document may have been prepared by [facility] Voice Recognition software and as such may include unintentional dictation errors.

## 2023-12-05 LAB — LIPID PANEL W/O CHOL/HDL RATIO
Cholesterol, Total: 129 mg/dL (ref 100–199)
HDL: 54 mg/dL (ref >39)
LDL Chol Calc (NIH): 58 mg/dL (ref 0–99)
Triglycerides: 90 mg/dL (ref 0–149)
VLDL Cholesterol Cal: 17 mg/dL (ref 5–40)

## 2023-12-05 LAB — HEMOGLOBIN A1C
Est. average glucose Bld gHb Est-mCnc: 200 mg/dL
Hgb A1c MFr Bld: 8.6 % — ABNORMAL HIGH (ref 4.8–5.6)

## 2023-12-05 LAB — CMP14+EGFR
ALT: 8 IU/L (ref 0–32)
AST: 13 IU/L (ref 0–40)
Albumin: 3.6 g/dL — ABNORMAL LOW (ref 3.8–4.8)
Alkaline Phosphatase: 96 IU/L (ref 44–121)
BUN/Creatinine Ratio: 13 (ref 12–28)
BUN: 10 mg/dL (ref 8–27)
Bilirubin Total: 0.3 mg/dL (ref 0.0–1.2)
CO2: 26 mmol/L (ref 20–29)
Calcium: 9.6 mg/dL (ref 8.7–10.3)
Chloride: 98 mmol/L (ref 96–106)
Creatinine, Ser: 0.77 mg/dL (ref 0.57–1.00)
Globulin, Total: 3.2 g/dL (ref 1.5–4.5)
Glucose: 146 mg/dL — ABNORMAL HIGH (ref 70–99)
Potassium: 3.6 mmol/L (ref 3.5–5.2)
Sodium: 138 mmol/L (ref 134–144)
Total Protein: 6.8 g/dL (ref 6.0–8.5)
eGFR: 80 mL/min/1.73 (ref >59)

## 2023-12-30 ENCOUNTER — Ambulatory Visit (INDEPENDENT_AMBULATORY_CARE_PROVIDER_SITE_OTHER): Payer: 59 | Admitting: Internal Medicine

## 2023-12-30 ENCOUNTER — Encounter: Payer: Self-pay | Admitting: Internal Medicine

## 2023-12-30 VITALS — BP 126/60 | HR 49 | Ht 66.0 in | Wt 179.4 lb

## 2023-12-30 DIAGNOSIS — R413 Other amnesia: Secondary | ICD-10-CM

## 2023-12-30 DIAGNOSIS — E1159 Type 2 diabetes mellitus with other circulatory complications: Secondary | ICD-10-CM | POA: Diagnosis not present

## 2023-12-30 DIAGNOSIS — I152 Hypertension secondary to endocrine disorders: Secondary | ICD-10-CM

## 2023-12-30 DIAGNOSIS — E119 Type 2 diabetes mellitus without complications: Secondary | ICD-10-CM

## 2023-12-30 DIAGNOSIS — Z794 Long term (current) use of insulin: Secondary | ICD-10-CM

## 2023-12-30 DIAGNOSIS — E1169 Type 2 diabetes mellitus with other specified complication: Secondary | ICD-10-CM

## 2023-12-30 DIAGNOSIS — E782 Mixed hyperlipidemia: Secondary | ICD-10-CM

## 2023-12-30 LAB — POCT CBG (FASTING - GLUCOSE)-MANUAL ENTRY: Glucose Fasting, POC: 116 mg/dL — AB (ref 70–99)

## 2023-12-30 NOTE — Progress Notes (Signed)
 Established Patient Office Visit  Subjective:  Patient ID: Gloria Blair, female    DOB: 10/25/1946  Age: 78 y.o. MRN: [id number]  Chief Complaint  Patient presents with   Follow-up    Diabetes follow up    Patient comes in for her follow-up accompanied by her daughter.  She is doing well and has no new complaints.  Her blood pressure is looking better also.  Her labs done at last visit showed a jump in her hemoglobin A1c.  They admit that patient was noncompliant with her diet due to the holidays.  Since then she is controlling her diet more closely.  And her insulin  dose was increased to 22 units as well.    No other concerns at this time.   Past Medical History:  Diagnosis Date   Asthma    Diabetes mellitus without complication (HCC)    GERD (gastroesophageal reflux disease)    Hypertension    Obesity     Past Surgical History:  Procedure Laterality Date   COLONOSCOPY WITH PROPOFOL  N/A 08/07/2015   Procedure: COLONOSCOPY WITH PROPOFOL ;  Surgeon: Donnice Vaughn Manes, MD;  Location: [facility] ENDOSCOPY;  Service: Endoscopy;  Laterality: N/A;    Social History   Socioeconomic History   Marital status: Widowed    Spouse name: Not on file   Number of children: Not on file   Years of education: Not on file   Highest education level: Not on file  Occupational History   Not on file  Tobacco Use   Smoking status: Never   Smokeless tobacco: Never  Substance and Sexual Activity   Alcohol use: Not on file   Drug use: Not on file   Sexual activity: Not on file  Other Topics Concern   Not on file  Social History Narrative   Not on file   Social Drivers of Health   Financial Resource Strain: Not on file  Food Insecurity: Not on file  Transportation Needs: Not on file  Physical Activity: Not on file  Stress: Not on file  Social Connections: Not on file  Intimate Partner Violence: Not on file    Family History  Problem Relation Age of Onset   Breast cancer Neg Hx      No Known Allergies  Outpatient Medications Prior to Visit  Medication Sig   ACCU-CHEK AVIVA PLUS test strip 1 each by Other route as needed.   albuterol  (VENTOLIN  HFA) 108 (90 Base) MCG/ACT inhaler Inhale 2 puffs into the lungs every 6 (six) hours as needed for wheezing.   amLODipine  (NORVASC ) 5 MG tablet Take 1 tablet (5 mg total) by mouth daily.   aspirin  81 MG tablet Take 81 mg by mouth daily.   atorvastatin  (LIPITOR) 80 MG tablet Take 1 tablet (80 mg total) by mouth daily.   donepezil  (ARICEPT ) 10 MG tablet Take 1 tablet (10 mg total) by mouth at bedtime.   geriatric multivitamins-minerals (ELDERTONIC/GEVRABON) ELIX Take 15 mLs by mouth daily.   insulin  detemir (LEVEMIR ) 100 UNIT/ML injection Inject 0.25 mLs (25 Units total) into the skin daily at 6 (six) AM.   lisinopril -hydrochlorothiazide  (ZESTORETIC ) 20-12.5 MG tablet Take 1 tablet by mouth daily.   meloxicam  (MOBIC ) 7.5 MG tablet Take 1 tablet (7.5 mg total) by mouth daily.   sitaGLIPtin -metformin  (JANUMET ) 50-1000 MG tablet Take 1 tablet by mouth 2 (two) times daily with a meal.   vitamin C  (ASCORBIC ACID) 250 MG tablet Take 1 tablet (250 mg total) by mouth daily.  Vitamin D , Ergocalciferol , (DRISDOL ) 1.25 MG (50000 UNIT) CAPS capsule Take 1 capsule (50,000 Units total) by mouth every 7 (seven) days.   No facility-administered medications prior to visit.    Review of Systems  Constitutional: Negative.  Negative for chills, fever and malaise/fatigue.  HENT: Negative.  Negative for sore throat.   Eyes: Negative.   Respiratory: Negative.  Negative for cough, shortness of breath and stridor.   Cardiovascular: Negative.  Negative for chest pain, palpitations and leg swelling.  Gastrointestinal: Negative.  Negative for abdominal pain, constipation, diarrhea, heartburn, nausea and vomiting.  Genitourinary: Negative.  Negative for dysuria and flank pain.  Musculoskeletal: Negative.  Negative for joint pain and myalgias.  Skin:  Negative.   Neurological: Negative.  Negative for dizziness, tingling and headaches.  Endo/Heme/Allergies: Negative.   Psychiatric/Behavioral: Negative.  Negative for depression and suicidal ideas. The patient is not nervous/anxious.        Objective:   BP 126/60   Pulse (!) 49   Ht 5' 6 (1.676 m)   Wt 179 lb 6.4 oz (81.4 kg)   SpO2 99%   BMI 28.96 kg/m   Vitals:   12/30/23 0908  BP: 126/60  Pulse: (!) 49  Height: 5' 6 (1.676 m)  Weight: 179 lb 6.4 oz (81.4 kg)  SpO2: 99%  BMI (Calculated): 28.97    Physical Exam Vitals and nursing note reviewed.  Constitutional:      Appearance: Normal appearance.  HENT:     Head: Normocephalic and atraumatic.     Nose: Nose normal.     Mouth/Throat:     Mouth: Mucous membranes are moist.     Pharynx: Oropharynx is clear.  Eyes:     Conjunctiva/sclera: Conjunctivae normal.     Pupils: Pupils are equal, round, and reactive to light.  Cardiovascular:     Rate and Rhythm: Normal rate and regular rhythm.     Pulses: Normal pulses.     Heart sounds: Normal heart sounds. No murmur heard. Pulmonary:     Effort: Pulmonary effort is normal.     Breath sounds: Normal breath sounds. No wheezing.  Abdominal:     General: Bowel sounds are normal.     Palpations: Abdomen is soft.     Tenderness: There is no abdominal tenderness. There is no right CVA tenderness or left CVA tenderness.  Musculoskeletal:        General: Normal range of motion.     Cervical back: Normal range of motion.     Right lower leg: No edema.     Left lower leg: No edema.  Skin:    General: Skin is warm and dry.  Neurological:     General: No focal deficit present.     Mental Status: She is alert and oriented to person, place, and time.  Psychiatric:        Mood and Affect: Mood normal.        Behavior: Behavior normal.      Results for orders placed or performed in visit on 12/30/23  POCT CBG (Fasting - Glucose)  Result Value Ref Range   Glucose  Fasting, POC 116 (A) 70 - 99 mg/dL    Recent Results (from the past 2160 hours)  POCT CBG (Fasting - Glucose)     Status: Abnormal   Collection Time: 10/03/23  9:42 AM  Result Value Ref Range   Glucose Fasting, POC 131 (A) 70 - 99 mg/dL  CBC with Diff     Status:  Abnormal   Collection Time: 10/03/23 10:21 AM  Result Value Ref Range   WBC 5.8 3.4 - 10.8 x10E3/uL   RBC 4.67 3.77 - 5.28 x10E6/uL   Hemoglobin 12.6 11.1 - 15.9 g/dL   Hematocrit 59.6 65.9 - 46.6 %   MCV 86 79 - 97 fL   MCH 27.0 26.6 - 33.0 pg   MCHC 31.3 (L) 31.5 - 35.7 g/dL   RDW 86.1 88.2 - 84.5 %   Platelets 215 150 - 450 x10E3/uL   Neutrophils 60 Not Estab. %   Lymphs 28 Not Estab. %   Monocytes 10 Not Estab. %   Eos 2 Not Estab. %   Basos 0 Not Estab. %   Neutrophils Absolute 3.5 1.4 - 7.0 x10E3/uL   Lymphocytes Absolute 1.6 0.7 - 3.1 x10E3/uL   Monocytes Absolute 0.6 0.1 - 0.9 x10E3/uL   EOS (ABSOLUTE) 0.1 0.0 - 0.4 x10E3/uL   Basophils Absolute 0.0 0.0 - 0.2 x10E3/uL   Immature Granulocytes 0 Not Estab. %   Immature Grans (Abs) 0.0 0.0 - 0.1 x10E3/uL  Hemoglobin A1c     Status: Abnormal   Collection Time: 10/03/23 10:21 AM  Result Value Ref Range   Hgb A1c MFr Bld 7.7 (H) 4.8 - 5.6 %    Comment:          Prediabetes: 5.7 - 6.4          Diabetes: >6.4          Glycemic control for adults with diabetes: <7.0    Est. average glucose Bld gHb Est-mCnc 174 mg/dL  Lipid Panel w/o Chol/HDL Ratio     Status: None   Collection Time: 10/03/23 10:21 AM  Result Value Ref Range   Cholesterol, Total 122 100 - 199 mg/dL   Triglycerides 65 0 - 149 mg/dL   HDL 54 >60 mg/dL   VLDL Cholesterol Cal 14 5 - 40 mg/dL   LDL Chol Calc (NIH) 54 0 - 99 mg/dL  RFE85+ZHQM     Status: Abnormal   Collection Time: 10/03/23 10:21 AM  Result Value Ref Range   Glucose 129 (H) 70 - 99 mg/dL   BUN 12 8 - 27 mg/dL   Creatinine, Ser 9.22 0.57 - 1.00 mg/dL   eGFR 80 >40 fO/fpw/8.26   BUN/Creatinine Ratio 16 12 - 28   Sodium 138 134  - 144 mmol/L   Potassium 4.0 3.5 - 5.2 mmol/L   Chloride 100 96 - 106 mmol/L   CO2 24 20 - 29 mmol/L   Calcium  9.6 8.7 - 10.3 mg/dL   Total Protein 6.8 6.0 - 8.5 g/dL   Albumin 3.7 (L) 3.8 - 4.8 g/dL   Globulin, Total 3.1 1.5 - 4.5 g/dL   Bilirubin Total 0.4 0.0 - 1.2 mg/dL   Alkaline Phosphatase 90 44 - 121 IU/L   AST 16 0 - 40 IU/L   ALT 11 0 - 32 IU/L  POCT Glucose (CBG)     Status: Abnormal   Collection Time: 11/03/23  3:34 PM  Result Value Ref Range   POC Glucose 103 (A) 70 - 99 mg/dl  POCT CBG (Fasting - Glucose)     Status: Abnormal   Collection Time: 12/04/23 10:07 AM  Result Value Ref Range   Glucose Fasting, POC 150 (A) 70 - 99 mg/dL  RFE85+ZHQM     Status: Abnormal   Collection Time: 12/04/23 10:38 AM  Result Value Ref Range   Glucose 146 (H) 70 - 99 mg/dL  BUN 10 8 - 27 mg/dL   Creatinine, Ser 9.22 0.57 - 1.00 mg/dL   eGFR 80 >40 fO/fpw/8.26   BUN/Creatinine Ratio 13 12 - 28   Sodium 138 134 - 144 mmol/L   Potassium 3.6 3.5 - 5.2 mmol/L   Chloride 98 96 - 106 mmol/L   CO2 26 20 - 29 mmol/L   Calcium  9.6 8.7 - 10.3 mg/dL   Total Protein 6.8 6.0 - 8.5 g/dL   Albumin 3.6 (L) 3.8 - 4.8 g/dL   Globulin, Total 3.2 1.5 - 4.5 g/dL   Bilirubin Total 0.3 0.0 - 1.2 mg/dL   Alkaline Phosphatase 96 44 - 121 IU/L   AST 13 0 - 40 IU/L   ALT 8 0 - 32 IU/L  Lipid Panel w/o Chol/HDL Ratio     Status: None   Collection Time: 12/04/23 10:38 AM  Result Value Ref Range   Cholesterol, Total 129 100 - 199 mg/dL   Triglycerides 90 0 - 149 mg/dL   HDL 54 >60 mg/dL   VLDL Cholesterol Cal 17 5 - 40 mg/dL   LDL Chol Calc (NIH) 58 0 - 99 mg/dL  Hemoglobin J8r     Status: Abnormal   Collection Time: 12/04/23 10:38 AM  Result Value Ref Range   Hgb A1c MFr Bld 8.6 (H) 4.8 - 5.6 %    Comment:          Prediabetes: 5.7 - 6.4          Diabetes: >6.4          Glycemic control for adults with diabetes: <7.0    Est. average glucose Bld gHb Est-mCnc 200 mg/dL  POCT CBG (Fasting -  Glucose)     Status: Abnormal   Collection Time: 12/30/23  9:17 AM  Result Value Ref Range   Glucose Fasting, POC 116 (A) 70 - 99 mg/dL      Assessment & Plan:  Continue current medications.  Strict diet control emphasized. Problem List Items Addressed This Visit     Type 2 diabetes mellitus with hyperglycemia, without long-term current use of insulin  (HCC)   Memory impairment   Hypertension associated with diabetes (HCC) - Primary   Combined hyperlipidemia associated with type 2 diabetes mellitus (HCC)    Return in about 2 months (around 02/27/2024).   Total time spent: 30 minutes  FERNAND FREDY RAMAN, MD  12/30/2023   This document may have been prepared by [facility] Voice Recognition software and as such may include unintentional dictation errors.

## 2024-01-05 ENCOUNTER — Telehealth: Payer: Self-pay

## 2024-01-05 NOTE — Telephone Encounter (Signed)
 Pt daughter called asking for call back, VM was hard to hear and understand, asked for call back

## 2024-01-06 ENCOUNTER — Other Ambulatory Visit: Payer: Self-pay | Admitting: Internal Medicine

## 2024-01-06 DIAGNOSIS — F03A4 Unspecified dementia, mild, with anxiety: Secondary | ICD-10-CM

## 2024-01-06 DIAGNOSIS — E782 Mixed hyperlipidemia: Secondary | ICD-10-CM

## 2024-01-06 MED ORDER — SITAGLIPTIN PHOS-METFORMIN HCL 50-1000 MG PO TABS: 1.0000 | ORAL_TABLET | Freq: Two times a day (BID) | ORAL | 3 refills | Status: AC

## 2024-01-06 MED ORDER — ATORVASTATIN CALCIUM 80 MG PO TABS
80.0000 mg | ORAL_TABLET | Freq: Every day | ORAL | 3 refills | Status: AC
Start: 2024-01-06 — End: ?

## 2024-01-06 MED ORDER — TRAZODONE HCL 50 MG PO TABS
25.0000 mg | ORAL_TABLET | Freq: Every day | ORAL | 1 refills | Status: DC
Start: 2024-01-06 — End: 2024-07-19

## 2024-02-27 ENCOUNTER — Ambulatory Visit: Payer: 59 | Admitting: Internal Medicine

## 2024-03-04 ENCOUNTER — Ambulatory Visit: Payer: 59 | Admitting: Internal Medicine

## 2024-03-30 ENCOUNTER — Other Ambulatory Visit: Payer: Self-pay | Admitting: Internal Medicine

## 2024-07-17 ENCOUNTER — Other Ambulatory Visit: Payer: Self-pay | Admitting: Internal Medicine

## 2024-07-17 DIAGNOSIS — F03A4 Unspecified dementia, mild, with anxiety: Secondary | ICD-10-CM

## 2024-08-30 ENCOUNTER — Other Ambulatory Visit: Payer: Self-pay | Admitting: Internal Medicine

## 2024-08-30 DIAGNOSIS — F03A4 Unspecified dementia, mild, with anxiety: Secondary | ICD-10-CM

## 2024-09-01 ENCOUNTER — Other Ambulatory Visit: Payer: Self-pay | Admitting: Internal Medicine

## 2024-09-01 DIAGNOSIS — F03A4 Unspecified dementia, mild, with anxiety: Secondary | ICD-10-CM

## 2024-09-21 ENCOUNTER — Emergency Department

## 2024-09-21 ENCOUNTER — Other Ambulatory Visit: Payer: Self-pay

## 2024-09-21 ENCOUNTER — Emergency Department
Admission: EM | Admit: 2024-09-21 | Discharge: 2024-09-21 | Disposition: A | Discharge status: Home / Self Care | Attending: Emergency Medicine | Admitting: Emergency Medicine

## 2024-09-21 DIAGNOSIS — S8992XA Unspecified injury of left lower leg, initial encounter: Secondary | ICD-10-CM | POA: Diagnosis present

## 2024-09-21 DIAGNOSIS — W010XXA Fall on same level from slipping, tripping and stumbling without subsequent striking against object, initial encounter: Secondary | ICD-10-CM | POA: Diagnosis not present

## 2024-09-21 DIAGNOSIS — J45909 Unspecified asthma, uncomplicated: Secondary | ICD-10-CM | POA: Diagnosis not present

## 2024-09-21 DIAGNOSIS — S82832A Other fracture of upper and lower end of left fibula, initial encounter for closed fracture: Secondary | ICD-10-CM | POA: Diagnosis not present

## 2024-09-21 DIAGNOSIS — E119 Type 2 diabetes mellitus without complications: Secondary | ICD-10-CM | POA: Diagnosis not present

## 2024-09-21 DIAGNOSIS — F0390 Unspecified dementia without behavioral disturbance: Secondary | ICD-10-CM | POA: Insufficient documentation

## 2024-09-21 DIAGNOSIS — I10 Essential (primary) hypertension: Secondary | ICD-10-CM | POA: Diagnosis not present

## 2024-09-21 MED ORDER — ACETAMINOPHEN 500 MG PO TABS
1000.0000 mg | ORAL_TABLET | Freq: Once | ORAL | Status: AC
  Administered 2024-09-21: 1000 mg via ORAL
  Filled 2024-09-21: qty 2

## 2024-09-21 NOTE — Discharge Instructions (Addendum)
 Please read through the included information about splint care (keep it clean and dry).  If your pain becomes much more severe, the splint becomes too tight, or you feel as if your injured limb is becoming numb or cold, please return immediately to the Emergency Department. Keep the splint dry.  Follow up with the podiatry specialist (Dr. Ashley) listed in this paperwork.  When possible, keep your splint elevated to help with the swelling.  You may also use ice packs over the splint.  You may take Tylenol  or 1000 mg every 6 hours for pain. Remain nonweightbearing in wheelchair until re-evaluated by podiatry.

## 2024-09-21 NOTE — ED Triage Notes (Signed)
 Pt presents to the ED via POV from home with daughter. Daughter states that patient fell during the night. Did not hit head. No LOC. Reports left foot pain and swelling. Pt has hx of advanced dementia.

## 2024-09-21 NOTE — ED Provider Notes (Addendum)
 [facility] Provider Note    Event Date/Time   First MD Initiated Contact with Patient 09/21/24 1015     (approximate)   History   Fall   HPI  Gloria Blair is a 78 y.o. female  with a past medical history of paroxysmal A-fib, hypertension, type 2 diabetes, asthma, GERD, dementia presents to the emergency department with left leg and foot pain after a fall that occurred last night. Daughter is present in the room with her and helping provide history given patient's history of dementia.  Daughter stated her husband heard the patient fall and went to check on her. Floor was wet around the patient. Daughter states patient urinated in her brief while trying to get to the bathroom (she has regular periods of incontinence per daughter) and some got on the floor and she slipped on her left foot in it.  Husband found the patient down on her left lower extremity sitting sideways.  Patient normally ambulates by herself without assistance. Daughter denies patient hitting her head, LOC, fever, any other injuries. Denies bumps or bruises to side of head. Mother reports patient has been acting like herself.   Physical Exam   Triage Vital Signs: ED Triage Vitals  Encounter Vitals Group     BP 09/21/24 0950 (!) 145/89     Girls Systolic BP Percentile --      Girls Diastolic BP Percentile --      Boys Systolic BP Percentile --      Boys Diastolic BP Percentile --      Pulse Rate 09/21/24 0950 66     Resp 09/21/24 0950 18     Temp 09/21/24 0950 98.4 F (36.9 C)     Temp Source 09/21/24 0950 Oral     SpO2 09/21/24 0950 99 %     Weight 09/21/24 0951 178 lb 9.2 oz (81 kg)     Height 09/21/24 0951 5' 6 (1.676 m)     Head Circumference --      Peak Flow --      Pain Score --      Pain Loc --      Pain Education --      Exclude from Growth Chart --     Most recent vital signs: Vitals:   09/21/24 0950  BP: (!) 145/89  Pulse: 66  Resp: 18  Temp: 98.4 F (36.9 C)   SpO2: 99%    General: Awake, in no acute distress. Able to answer some questions. Somewhat limited exam. Head: Normocephalic, atraumatic.  Eyes: EOMs intact.  Neck: Supple, no midline cervical tenderness. CV: Good peripheral perfusion. DP and PT pulses 1+ b/l.  Respiratory:Normal respiratory effort.  No respiratory distress. CTAB. GI: Soft, non-distended, non-tender.  MSK: Limited left ankle and foot exam as patient can only follow partial directions. TTP along distal left anterior and posterior shin and top of left foot with some associated swelling. Skin:Warm, dry, intact. No ecchymoses or open wound. Neurological: A&Ox4 to person, place, time, and situation.   ED Results / Procedures / Treatments   Labs (all labs ordered are listed, but only abnormal results are displayed) Labs Reviewed - No data to display   EKG     RADIOLOGY Left foot and ankle x-ray ordered.  FINDINGS:   BONES AND JOINTS: Mildly displaced oblique fracture of distal left fibula is noted. Moderate posterior calcaneal spurring. No joint dislocation.   SOFT TISSUES: Overlying soft tissue swelling is noted. Vascular calcifications  are noted.   IMPRESSION: 1. Mildly displaced oblique fracture of the distal left fibula. 2. Overlying soft tissue swelling. 3. Moderate posterior calcaneal spurring. 4. Vascular calcifications.  PROCEDURES:  Critical Care performed: No   Procedures   MEDICATIONS ORDERED IN ED: Medications  acetaminophen  (TYLENOL ) tablet 1,000 mg (1,000 mg Oral Given 09/21/24 1038)     IMPRESSION / MDM / ASSESSMENT AND PLAN / ED COURSE  I reviewed the triage vital signs and the nursing notes.                              Differential diagnosis includes, but is not limited to, distal fibular fracture, distal tibial fracture, ankle sprain, bimalleolar vs trimalleolar fracture  Patient's presentation is most consistent with acute complicated illness / injury requiring  diagnostic workup.  Patient is a 79 year old female presenting with left ankle and foot pain after a fall.  No signs of head trauma or midline neck pain. Patient found sitting upright on left side after the fall. Daughter would not like to do imaging to r/o head or neck injury at this time. X-rays of left foot and ankle ordered in triage. I independently viewed the x-rays and radiologist's reports.  I agree with the radiologist's report that there is a mildly displaced oblique distal left fibular fracture. Patient has 1+ distal pulses b/l, warm skin, limited ROM exam due to dementia. 1000 mg Tylenol  given. Patient placed in posterior leg splint and splint was checked prior to discharge. Splint care instructions discussed with the family and return precautions.  Discussed with podiatry on call, Dr. Eva Gay, who recommended nonweightbearing and f/u in his office in 1 week.  Patient's guardian was given the opportunity to ask questions; all questions were answered. The patient may return to the emergency department for any new, worsening, or concerning symptoms. Emergency department return precautions were discussed with the patient's guardian.  Patient's guardian is in agreement to the treatment plan.  Patient is stable for discharge.     FINAL CLINICAL IMPRESSION(S) / ED DIAGNOSES   Final diagnoses:  Closed fracture of distal end of left fibula, unspecified fracture morphology, initial encounter     Rx / DC Orders   ED Discharge Orders     None        Note:  This document was prepared using Dragon voice recognition software and may include unintentional dictation errors.     Sheron Salm, PA-C 09/21/24 1145    [address] [place], PA-C 09/21/24 1148    Jacolyn Pae, MD 09/21/24 1552

## 2024-11-15 ENCOUNTER — Emergency Department

## 2024-11-15 ENCOUNTER — Emergency Department: Admission: EM | Admit: 2024-11-15 | Discharge: 2024-11-15 | Disposition: A | Discharge status: Home / Self Care

## 2024-11-15 ENCOUNTER — Other Ambulatory Visit: Payer: Self-pay

## 2024-11-15 DIAGNOSIS — E1165 Type 2 diabetes mellitus with hyperglycemia: Secondary | ICD-10-CM | POA: Diagnosis not present

## 2024-11-15 DIAGNOSIS — R161 Splenomegaly, not elsewhere classified: Secondary | ICD-10-CM | POA: Diagnosis not present

## 2024-11-15 DIAGNOSIS — Z794 Long term (current) use of insulin: Secondary | ICD-10-CM | POA: Diagnosis not present

## 2024-11-15 DIAGNOSIS — F0390 Unspecified dementia without behavioral disturbance: Secondary | ICD-10-CM | POA: Diagnosis not present

## 2024-11-15 DIAGNOSIS — S0083XA Contusion of other part of head, initial encounter: Secondary | ICD-10-CM | POA: Diagnosis not present

## 2024-11-15 DIAGNOSIS — R569 Unspecified convulsions: Secondary | ICD-10-CM | POA: Diagnosis present

## 2024-11-15 DIAGNOSIS — E785 Hyperlipidemia, unspecified: Secondary | ICD-10-CM | POA: Insufficient documentation

## 2024-11-15 DIAGNOSIS — I10 Essential (primary) hypertension: Secondary | ICD-10-CM | POA: Diagnosis not present

## 2024-11-15 DIAGNOSIS — R109 Unspecified abdominal pain: Secondary | ICD-10-CM | POA: Diagnosis present

## 2024-11-15 DIAGNOSIS — I700 Atherosclerosis of aorta: Secondary | ICD-10-CM | POA: Insufficient documentation

## 2024-11-15 DIAGNOSIS — I4891 Unspecified atrial fibrillation: Secondary | ICD-10-CM | POA: Diagnosis not present

## 2024-11-15 DIAGNOSIS — W19XXXA Unspecified fall, initial encounter: Secondary | ICD-10-CM | POA: Diagnosis not present

## 2024-11-15 DIAGNOSIS — S0990XA Unspecified injury of head, initial encounter: Secondary | ICD-10-CM | POA: Diagnosis present

## 2024-11-15 LAB — URINALYSIS, COMPLETE (UACMP) WITH MICROSCOPIC
Bilirubin Urine: NEGATIVE
Glucose, UA: 50 mg/dL — AB
Hgb urine dipstick: NEGATIVE
Ketones, ur: NEGATIVE mg/dL
Leukocytes,Ua: NEGATIVE
Nitrite: NEGATIVE
Protein, ur: NEGATIVE mg/dL
RBC / HPF: 0 RBC/hpf (ref 0–5)
Specific Gravity, Urine: 1.013 (ref 1.005–1.030)
WBC, UA: 0 WBC/hpf (ref 0–5)
pH: 7 (ref 5.0–8.0)

## 2024-11-15 LAB — COMPREHENSIVE METABOLIC PANEL WITH GFR
ALT: 13 U/L (ref 0–44)
AST: 24 U/L (ref 15–41)
Albumin: 3.8 g/dL (ref 3.5–5.0)
Alkaline Phosphatase: 71 U/L (ref 38–126)
Anion gap: 20 — ABNORMAL HIGH (ref 5–15)
BUN: 7 mg/dL — ABNORMAL LOW (ref 8–23)
CO2: 19 mmol/L — ABNORMAL LOW (ref 22–32)
Calcium: 9.6 mg/dL (ref 8.9–10.3)
Chloride: 101 mmol/L (ref 98–111)
Creatinine, Ser: 0.84 mg/dL (ref 0.44–1.00)
GFR, Estimated: >60 mL/min
Glucose, Bld: 145 mg/dL — ABNORMAL HIGH (ref 70–99)
Potassium: 3.7 mmol/L (ref 3.5–5.1)
Sodium: 140 mmol/L (ref 135–145)
Total Bilirubin: 0.6 mg/dL (ref 0.0–1.2)
Total Protein: 7.3 g/dL (ref 6.5–8.1)

## 2024-11-15 LAB — CBC WITH DIFFERENTIAL/PLATELET
Abs Immature Granulocytes: 0.07 K/uL (ref 0.00–0.07)
Basophils Absolute: 0 K/uL (ref 0.0–0.1)
Basophils Relative: 1 %
Eosinophils Absolute: 0.1 K/uL (ref 0.0–0.5)
Eosinophils Relative: 2 %
HCT: 38 % (ref 36.0–46.0)
Hemoglobin: 12.4 g/dL (ref 12.0–15.0)
Immature Granulocytes: 1 %
Lymphocytes Relative: 16 %
Lymphs Abs: 0.9 K/uL (ref 0.7–4.0)
MCH: 27.7 pg (ref 26.0–34.0)
MCHC: 32.6 g/dL (ref 30.0–36.0)
MCV: 85 fL (ref 80.0–100.0)
Monocytes Absolute: 0.4 K/uL (ref 0.1–1.0)
Monocytes Relative: 7 %
Neutro Abs: 4.1 K/uL (ref 1.7–7.7)
Neutrophils Relative %: 73 %
Platelets: 63 K/uL — ABNORMAL LOW (ref 150–400)
RBC: 4.47 MIL/uL (ref 3.87–5.11)
RDW: 14.9 % (ref 11.5–15.5)
Smear Review: NORMAL
WBC: 5.6 K/uL (ref 4.0–10.5)
nRBC: 0 % (ref 0.0–0.2)

## 2024-11-15 LAB — BLOOD GAS, VENOUS
Acid-Base Excess: 0.8 mmol/L (ref 0.0–2.0)
Bicarbonate: 26.6 mmol/L (ref 20.0–28.0)
O2 Saturation: 61.5 %
Patient temperature: 37
pCO2, Ven: 46 mmHg (ref 44–60)
pH, Ven: 7.37 (ref 7.25–7.43)
pO2, Ven: 37 mmHg (ref 32–45)

## 2024-11-15 LAB — TROPONIN T, HIGH SENSITIVITY: Troponin T High Sensitivity: <15 ng/L (ref 0–19)

## 2024-11-15 LAB — ETHANOL: Alcohol, Ethyl (B): <15 mg/dL

## 2024-11-15 LAB — PROTIME-INR
INR: 1 (ref 0.8–1.2)
Prothrombin Time: 13.3 s (ref 11.4–15.2)

## 2024-11-15 LAB — BETA-HYDROXYBUTYRIC ACID: Beta-Hydroxybutyric Acid: 0.11 mmol/L (ref 0.05–0.27)

## 2024-11-15 LAB — CBG MONITORING, ED: Glucose-Capillary: 184 mg/dL — ABNORMAL HIGH (ref 70–99)

## 2024-11-15 MED ORDER — LEVETIRACETAM (KEPPRA) 500 MG/5 ML ADULT IV PUSH
1000.0000 mg | Freq: Once | INTRAVENOUS | Status: AC
  Administered 2024-11-15: 1000 mg via INTRAVENOUS
  Filled 2024-11-15: qty 10

## 2024-11-15 MED ORDER — LISINOPRIL 10 MG PO TABS
20.0000 mg | ORAL_TABLET | Freq: Once | ORAL | Status: AC
  Administered 2024-11-15: 20 mg via ORAL
  Filled 2024-11-15: qty 2

## 2024-11-15 MED ORDER — IOHEXOL 350 MG/ML SOLN
100.0000 mL | Freq: Once | INTRAVENOUS | Status: AC | PRN
  Administered 2024-11-15: 100 mL via INTRAVENOUS

## 2024-11-15 MED ORDER — AMLODIPINE BESYLATE 5 MG PO TABS
5.0000 mg | ORAL_TABLET | Freq: Once | ORAL | Status: AC
  Administered 2024-11-15: 5 mg via ORAL
  Filled 2024-11-15: qty 1

## 2024-11-15 MED ORDER — SODIUM CHLORIDE 0.9 % IV BOLUS
1000.0000 mL | Freq: Once | INTRAVENOUS | Status: AC
  Administered 2024-11-15: 1000 mL via INTRAVENOUS

## 2024-11-15 NOTE — ED Provider Notes (Signed)
 "  [facility] Provider Note    Event Date/Time   First MD Initiated Contact with Patient 11/15/24 1333     (approximate)   History   Seizures  Pt to ED via [facility] from home. Pt had witnessed seizure by daughter. Daughter heard her fall and when daughter went into room pt appeared to be seizing. Pt does not have hx of seizures. Pt has cast on R foot. On EMS arrival pt was agonal breathing. EMS reports pt has been altered after seizure activity stopped. Pt takes insulin  and has hx dementia.  BP 179/palp CBG 579 SPO2 99% HR 90   HPI Gloria Blair is a 78 y.o. female PMH DM2, dementia, hypertension, hyperlipidemia, A-fib not on anticoagulation presents for evaluation after reported fall and apparent seizure -Per EMS, family was in another room when they heard a thud on the ground and found patient in full body convulsions.  On EMS arrival, reportedly agonal breathing pattern, no further seizure-like activity, did arouse when they moved her to a gurney.  Glucose in the 500s.  Vital signs stable with some hypertension.  He is saying yes to basic questions but does not appear to be comprehending anything.  Did note a hematoma to her right forehead.  Did have 1 episode of urination with them. -Noncontributory historian on my eval - Family quickly arrived at bedside.  Note they were with patient immediately preceding episode and it was actually witnessed.  State patient was complaining of a nonspecific side pain, unclear laterality.  Then made a strange sound and fell forward unconscious.  They were able to lay her down gently, no fall.  Then had about a 2-minute episode of what appeared to be a seizure.  No history of prior.  Had otherwise been in her usual state of health.  Per chart review, is being treated with a closed fracture of the fibula in her right leg.  He supposed to be nonambulatory on this leg.     Physical Exam   Triage Vital Signs: BP (!) 169/57    Pulse 73   Resp 18   Ht 5' 6 (1.676 m)   Wt 71.7 kg   SpO2 100%   BMI 25.50 kg/m     Most recent vital signs: Vitals:   11/15/24 1430 11/15/24 1530  BP: (!) 157/55 (!) 169/57  Pulse: 81 73  Resp: 19 18  SpO2: 100% 100%     General: Awake, no distress.  Appears postictal. HEENT: + Small hematoma right forehead, no other external evidence of trauma, no midline neck pain, able to range neck CV:  Good peripheral perfusion. RRR, RP 2+ Resp:  Normal effort. CTAB Abd:  No distention. Nontender to deep palpation throughout Other:  Nontender to palpation bilateral upper and lower extremities, pelvis stable. Neuro:  Alert, unclear orientation, moving all extremities spontaneously, face symmetric.  No focal motor deficit appreciated.   ED Results / Procedures / Treatments   Labs (all labs ordered are listed, but only abnormal results are displayed) Labs Reviewed  CBC WITH DIFFERENTIAL/PLATELET - Abnormal; Notable for the following components:      Result Value   Platelets 63 (*)    All other components within normal limits  PROTIME-INR  BLOOD GAS, VENOUS  BETA-HYDROXYBUTYRIC ACID  CBC WITH DIFFERENTIAL/PLATELET  ETHANOL  URINALYSIS, COMPLETE (UACMP) WITH MICROSCOPIC  COMPREHENSIVE METABOLIC PANEL WITH GFR  CBG MONITORING, ED  TROPONIN T, HIGH SENSITIVITY  TROPONIN T, HIGH SENSITIVITY  EKG  Ecg = sinus rhythm, rate 80, no gross ST elevation or depression, no significant repolarization abnormality, normal axis, normal intervals.  No clear evidence of ischemia no arrhythmia my interpretation.   RADIOLOGY Pending.     PROCEDURES:  Critical Care performed: No  Procedures   MEDICATIONS ORDERED IN ED: Medications  sodium chloride  0.9 % bolus 1,000 mL (1,000 mLs Intravenous New Bag/Given 11/15/24 1403)  levETIRAcetam  (KEPPRA ) undiluted injection 1,000 mg (1,000 mg Intravenous Given 11/15/24 1402)  iohexol  (OMNIPAQUE ) 350 MG/ML injection 100 mL (100 mLs  Intravenous Contrast Given 11/15/24 1547)     IMPRESSION / MDM / ASSESSMENT AND PLAN / ED COURSE  I reviewed the triage vital signs and the nursing notes.                              DDX/MDM/AP: Differential diagnosis includes, but is not limited to, primary seizure episode, consider underlying intracranial hemorrhage or mass, consider C-spine injury given age.  Consider transient arrhythmia/convulsive syncope, ACS.  Patient does have her right leg in a cast and has been treated for fracture, consider the possibility of PE with convulsive syncope despite no obvious leg swelling, also consider this given nonspecific report of abdominal/chest pain of unclear laterality.  Given notable hyperglycemia, also consider DKA/HHS.  Plan: -Labs - Keppra  load - CT head, C-spine -CT PE, CT abdomen pelvis - EKG  Patient's presentation is most consistent with acute presentation with potential threat to life or bodily function.  The patient is on the cardiac monitor to evaluate for evidence of arrhythmia and/or significant heart rate changes.  ED course below.  CBC, VBG, beta hydroxybutyrate reassuring.  Issue in lab is preventing CMP sample being drawn.  Imaging pending.  Signed out to oncoming ED provider pending following up imaging, and labs.  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  Clinical Course as of 11/15/24 1552  Mon Nov 15, 2024  1437 Vbg reassuring, no acidosis  Beta-hydroxybutyrate normal [MM]  1521 Spoke with lab, machines are not running, CMP will be delayed  Spoke with CT, they will do CT head and C-spine take patient back later for contrast imaging after renal function results [MM]    Clinical Course User Index [MM] Clarine Ozell LABOR, MD     FINAL CLINICAL IMPRESSION(S) / ED DIAGNOSES   Final diagnoses:  Seizure (HCC)     Rx / DC Orders   ED Discharge Orders     None        Note:  This document was prepared using Dragon voice recognition software and  may include unintentional dictation errors.   Clarine Ozell LABOR, MD 11/15/24 [phone]  "

## 2024-11-15 NOTE — ED Notes (Signed)
 RN spoke with [name] (pt's daughter) and discussed in detail pt's d/c instructions. Pt's daughter had no further questions at this time.

## 2024-11-15 NOTE — Discharge Instructions (Signed)
 Please continue home medications as prescribed.  Please follow-up with your primary care physician within the next few days.  I also recommend that you follow-up with neurology for further evaluation of the patient's seizure-like activity.  Your primary care physician can help with a referral to neurology.  Return immediately to the emergency department for new or worsening symptoms.

## 2024-11-15 NOTE — ED Provider Notes (Signed)
" °  Physical Exam  BP (!) 195/67   Pulse 69   Temp (!) 97.5 F (36.4 C) (Axillary)   Resp 17   Ht 5' 6 (1.676 m)   Wt 71.7 kg   SpO2 100%   BMI 25.50 kg/m   Physical Exam  Procedures  Procedures  ED Course / MDM   Clinical Course as of 11/15/24 2019  Mon Nov 15, 2024  1437 Vbg reassuring, no acidosis  Beta-hydroxybutyrate normal [MM]  1521 Spoke with lab, machines are not running, CMP will be delayed  Spoke with CT, they will do CT head and C-spine take patient back later for contrast imaging after renal function results [MM]    Clinical Course User Index [MM] Clarine Ozell LABOR, MD   Medical Decision Making Amount and/or Complexity of Data Reviewed Labs: ordered. Radiology: ordered. ECG/medicine tests: ordered.  Risk Prescription drug management.   This patient's care was signed out to me pending completion of her work up.  The patient had what sounds like witnessed seizure activity at home.  She does not have any history of seizure activity.  She was treated with a loading dose of Keppra  here in the emergency department.  Head CT and neck CT did not show any acute findings.  There is a 9 mm hypodensity to the spleen present on CTA of the chest/abdomen/pelvis.  Lab work is overall unremarkable.  No signs for infection or electrolyte abnormalities.  On reevaluation, the patient has returned to her baseline.  She was able to converse with me at bedside.  Patient's son is at bedside and reports that she seems to be back at her baseline.  I discussed with the patient and family that I am concerned that the patient did have a seizure however given her negative workup she may be discharged at this time to follow-up with her primary care physician as well as with neurology.  Discussed returning immediately to the emergency department for any new or worsening symptoms.  Family is agreeable with this plan of care.        Rexford Reche HERO, MD 11/15/24 2023  "

## 2024-11-15 NOTE — ED Triage Notes (Signed)
 Pt to ED via [facility] from home. Pt had witnessed seizure by daughter. Daughter heard her fall and when daughter went into room pt appeared to be seizing. Pt does not have hx of seizures. Pt has cast on R foot. On EMS arrival pt was agonal breathing. EMS reports pt has been altered after seizure activity stopped. Pt takes insulin  and has hx dementia.  BP 179/palp CBG 579 SPO2 99% HR 90

## 2024-11-15 NOTE — ED Notes (Signed)
 Pt's son taking pt home at this time. Pt able to ambulate with steady gait with standby assistance.
